# Patient Record
Sex: Male | Born: 1985 | Race: Asian
Health system: Southern US, Academic
[De-identification: ages and names within clinical notes are randomized; demographics above are authoritative.]

## PROBLEM LIST (undated history)

## (undated) ENCOUNTER — Encounter: Attending: Family Medicine

## (undated) ENCOUNTER — Encounter

## (undated) ENCOUNTER — Telehealth

## (undated) ENCOUNTER — Encounter: Attending: Family

## (undated) ENCOUNTER — Other Ambulatory Visit

## (undated) ENCOUNTER — Inpatient Hospital Stay: Payer: Medicare Other

## (undated) ENCOUNTER — Telehealth: Attending: Family

## (undated) HISTORY — PX: HERNIA REPAIR: SHX51

---

## 2016-01-10 ENCOUNTER — Encounter: Attending: Family | Primary: Family

## 2016-01-22 ENCOUNTER — Emergency Department: Admit: 2016-01-22 | Discharge: 2016-01-22

## 2016-01-22 ENCOUNTER — Inpatient Hospital Stay: Admit: 2016-01-22 | Discharge: 2016-01-22

## 2016-01-22 DIAGNOSIS — R197 Diarrhea, unspecified: Secondary | ICD-10-CM

## 2016-01-22 DIAGNOSIS — Z79899 Other long term (current) drug therapy: Secondary | ICD-10-CM

## 2016-01-22 DIAGNOSIS — R109 Unspecified abdominal pain: Principal | ICD-10-CM

## 2016-01-22 DIAGNOSIS — B349 Viral infection, unspecified: Secondary | ICD-10-CM

## 2016-01-22 DIAGNOSIS — I10 Essential (primary) hypertension: Principal | ICD-10-CM

## 2016-01-22 DIAGNOSIS — R0602 Shortness of breath: Secondary | ICD-10-CM

## 2016-01-22 DIAGNOSIS — Z6841 Body Mass Index (BMI) 40.0 and over, adult: Secondary | ICD-10-CM

## 2016-01-22 DIAGNOSIS — K219 Gastro-esophageal reflux disease without esophagitis: Secondary | ICD-10-CM

## 2016-01-22 DIAGNOSIS — R079 Chest pain, unspecified: Secondary | ICD-10-CM

## 2016-01-22 DIAGNOSIS — E669 Obesity, unspecified: Secondary | ICD-10-CM

## 2016-01-22 DIAGNOSIS — R110 Nausea: Secondary | ICD-10-CM

## 2016-01-22 DIAGNOSIS — R112 Nausea with vomiting, unspecified: Secondary | ICD-10-CM

## 2016-01-22 DIAGNOSIS — R1084 Generalized abdominal pain: Secondary | ICD-10-CM

## 2016-01-22 MED ORDER — RANITIDINE HCL 150 MG PO TABS
150 mg | Freq: Two times a day (BID) | ORAL | 0 refills | Status: CP
Start: 2016-01-22 — End: ?

## 2016-01-22 MED ORDER — BOLUS IV FLUID JX
1000 mL | Freq: Once | INTRAVENOUS | Status: CP
Start: 2016-01-22 — End: ?

## 2016-01-22 MED ORDER — FAMOTIDINE 10 MG/ML IV SOLN CUSTOM COMPONENT
20 mg | Freq: Once | INTRAVENOUS | Status: CP
Start: 2016-01-22 — End: ?

## 2016-01-22 MED ORDER — ONDANSETRON HCL 4 MG/2ML IJ SOLN
4 mg | Freq: Once | INTRAVENOUS | Status: CP
Start: 2016-01-22 — End: ?

## 2016-01-22 MED ORDER — MAALOX/HYOSCYAMINE UNIT DOSE JX
Freq: Once | ORAL | Status: CP
Start: 2016-01-22 — End: ?

## 2016-01-22 MED ORDER — ONDANSETRON 8 MG PO TBDP
8 mg | Freq: Three times a day (TID) | ORAL | 0 refills | Status: CP | PRN
Start: 2016-01-22 — End: 2016-08-31

## 2016-01-22 MED ORDER — KETOROLAC TROMETHAMINE 30 MG/ML IJ SOLN
30 mg | Freq: Once | INTRAVENOUS | Status: CP
Start: 2016-01-22 — End: ?

## 2016-01-22 MED ORDER — ESOMEPRAZOLE MAGNESIUM 20 MG PO CPDR
Freq: Every morning | ORAL
Start: 2016-01-22 — End: 2016-08-31

## 2016-01-22 MED ORDER — MORPHINE SULFATE 4 MG/ML IV SOLN CUSTOM JX
4 mg | Freq: Once | INTRAVENOUS | Status: CP
Start: 2016-01-22 — End: ?

## 2016-01-22 MED ORDER — LISINOPRIL 10 MG PO TABS
Freq: Every day | ORAL
Start: 2016-01-22 — End: ?

## 2016-01-22 NOTE — ED Notes
IV removed with catheter intact.  Pressure applied, Time of discharge: 0706  AM., Patient discharged to  Home.  Patient discharged  ambulatory. to exit with belongings in  Improved condition.  Patient escorted by  responsible driver., Written discharge instructions given to  patient.  Patient/recipient  to verbalizes discharge instructions.

## 2016-01-22 NOTE — ED Provider Notes
Clarity, UA Clear  Hazy    Specific Gravity, Urine 1.009  1.003 - 1.030    pH, Urine 6.0  4.5 - 8.0    Protein-UA Negative  Negative mg/dL    Glucose -Ur Negative  Negative mg/dL    Ketones UA Negative  Negative mg/dL    Bilirubin -Ur Negative  Negative    Blood -Ur Negative  Negative    Nitrite -Ur Negative  Negative    Urobilinogen -Ur Normal  Normal    Leukocytes -Ur Negative  Negative    RBC -Ur 2  0 - 5 /HPF    WBC -Ur <1  0 - 5 /HPF    Squam Epithel, UA <1  Not established /HPF    Bacteria -Ur None seen  None seen /HPF    ASCORBIC ACID Negative  20 mg/dL   CBC AND DIFFERENTIAL               Imaging (Read by ED Provider):  not applicable    Per Radiology: Xr Chest 2 Views Frontal & Lateral    Result Date: 01/22/2016  Study: XR CHEST 2 VIEWS FRONTAL & LATERAL HISTORY: [age] years Male. INDICATION: Chest pain. COMPARISON: none. FINDINGS: The cardiomediastinal silhouette and pulmonary vasculature are within normal limits. No focal airspace opacity is seen. Lung volumes are within normal limits. No pneumothorax or pleural effusion is identified. Osseous structures demonstrate no acute abnormality     No acute cardiopulmonary disease. I personally reviewed the images and the residents findings and agree with the above. Read By Valentino Nose- Jamie Ledford M.D.  Electronically Verified By Valentino Nose- Jamie Ledford M.D.  Released Date Time - 01/22/2016 6:03 AM  Resident - Gordy CouncilmanAlexandra High               EKG (Read by ED Provider):      NSR. HR of 62 bpm. No ST elevation. Normal intervals. Nonspecific t-wave abnormality. Slihgt interventricular delay.          MDM  Number of Diagnoses or Management Options  Chest pain, unspecified type:      Amount and/or Complexity of Data Reviewed  Clinical lab tests: ordered and reviewed  Tests in the radiology section of CPT?: ordered and reviewed  Obtain history from someone other than the patient: no  Discuss the patient with other providers: yes              HEART Score:

## 2016-01-22 NOTE — ED Provider Notes
Risk factors: no alcohol abuse, no aspirin use, not elderly, has not had multiple surgeries, no NSAID use and no recent hospitalization        No Known Allergies    Patient's Medications   New Prescriptions    No medications on file   Previous Medications    ESOMEPRAZOLE (NEXIUM) 20 MG CAPSULE DELAYED RELEASE    Take by mouth every morning (30 min before breakfast).    LISINOPRIL (PRINIVIL,ZESTRIL) 10 MG TABLET    Take by mouth daily.   Modified Medications    No medications on file   Discontinued Medications    No medications on file       Past Medical History:   Diagnosis Date   ? GERD (gastroesophageal reflux disease)    ? Hypertension        History reviewed. No pertinent surgical history.    History reviewed. No pertinent family history.    Social History     Social History   ? Marital status: Married     Spouse name: N/A   ? Number of children: N/A   ? Years of education: N/A     Social History Main Topics   ? Smoking status: Never Smoker   ? Smokeless tobacco: Never Used   ? Alcohol use No   ? Drug use: No   ? Sexual activity: Not Asked     Other Topics Concern   ? None     Social History Narrative   ? None       Review of Systems   Constitutional: Positive for fatigue. Negative for fever, chills, activity change and irritability.   HENT: Negative for hearing loss, sore throat and trouble swallowing.    Eyes: Negative for visual disturbance.   Respiratory: Negative for cough and wheezing.    Cardiovascular: Positive for chest pain. Negative for palpitations, leg swelling and exercise intolerance.   Gastrointestinal: Positive for nausea, diarrhea and anorexia. Negative for vomiting, abdominal pain, melena, hematochezia, abdominal distention and hematemesis.   Genitourinary: Negative for dysuria and hematuria.   Neurological: Positive for headaches.   All other systems reviewed and are negative.      Physical Exam       ED Triage Vitals   BP 01/22/16 0446 157/98   Pulse 01/22/16 0446 66

## 2016-01-22 NOTE — ED Notes
Pt arrived to ER via private vehicle with wife. Pt reports nausea, CP, SOB, abdominal pain x 2 days. Pt also reports diarrhea that has also began yesterday,pt has a two children at home who are ill with similar  viruses from school.

## 2016-01-22 NOTE — ED Provider Notes
Resp 01/22/16 0446 16   Temp 01/22/16 0446 36.8 ?C (98.2 ?F)   Temp src 01/22/16 0446 Oral   Height 01/22/16 0446 1.854 m   Weight 01/22/16 0446 147 kg   SpO2 01/22/16 0446 98 %   BMI (Calculated) 01/22/16 0446 42.84       BP (!) 157/98 - Pulse 66 - Temp 36.8 ?C (98.2 ?F) (Oral)  - Resp 16 - Ht 1.854 m - Wt (!) 147 kg - SpO2 98% - BMI 42.75 kg/m2    Physical Exam   Constitutional: He appears well-developed and well-nourished. He is active. No distress.   HENT:   Head: Atraumatic.   Right Ear: Tympanic membrane normal.   Left Ear: Tympanic membrane normal.   Nose: Nose normal.   Mouth/Throat: Mucous membranes are moist. Oropharynx is clear.   Eyes: Conjunctivae are normal. Pupils are equal, round, and reactive to light.   Neck: Normal range of motion and full passive range of motion without pain. Neck supple. No adenopathy.   Cardiovascular: Regular rhythm, S1 normal and S2 normal.    No murmur heard.  Pulmonary/Chest: Effort normal and breath sounds normal. No respiratory distress. No transmitted upper airway sounds. He has no decreased breath sounds. He has no wheezes. He has no rhonchi.   Abdominal: Soft. Bowel sounds are decreased. There is no hepatosplenomegaly. There is no tenderness. There is no rigidity, no rebound and no guarding. No hernia.   Musculoskeletal: He exhibits no edema.   Neurological: He is alert.   Skin: Skin is warm and dry. Capillary refill takes less than 3 seconds. He is not diaphoretic.   Nursing note and vitals reviewed.      Differential DDx: Gastroenteritis, PUD, Cholecystitis, Angina, MI, Other    Is this an Emergent Medical Condition? Yes - Severe Pain/Acute Onset of Symptons  409.901 FS  641.19 FS  627.732 (16) FS    ED Workup   Procedures    Labs:  - - No data to display      Imaging (Read by ED Provider):  not applicable    EKG (Read by ED Provider):  not applicable    MDM  Number of Diagnoses or Management Options  Chest pain, unspecified type:

## 2016-01-22 NOTE — ED Provider Notes
History     Chief Complaint   Patient presents with   ? Abdominal Pain   ? Chest Pain   ? Nausea       HPI Comments: Pt is a 30 yo male who presents to the ED c/o CP with SOB, nausea, and abdominal pain for 2 days. States the CP has been constant. Hx of GERD. Two year history of HTN. No hx of HLD or DM 2. States two of his children have some kind of stomach bug. Denies smoking, EtOH, or drug use. States he was at work when the pain started. Described as a pressure, substernal which does not radiate. States he feels weak and fatigued. Denies fevers, chills, or vomiting. Denies dysuria. Denies travel or tick bites. States the LLQ of his stomach hurts; cramping in nature with no radiation.     Patient is a 30 y.o. male presenting with abdominal pain. The history is provided by the patient.   Abdominal Pain   Pain location:  Generalized  Pain quality: aching, cramping, fullness and pressure    Pain quality: not bloating, not burning, not dull, not sharp, not shooting, not squeezing, not stabbing, no stiffness, not tearing, not throbbing and not tugging    Pain radiates to:  Does not radiate  Pain severity:  Moderate  Onset quality:  Gradual  Duration:  2 days  Timing:  Constant  Progression:  Worsening  Chronicity:  New  Context: sick contacts    Context: not alcohol use, not diet changes, not eating, not laxative use, not previous surgeries, not recent illness, not recent sexual activity, not recent travel, not suspicious food intake and not trauma    Relieved by:  Nothing  Worsened by:  Nothing  Ineffective treatments:  Lying down, liquids, OTC medications, movement, antacids and position changes  Associated symptoms: anorexia, chest pain, diarrhea, fatigue and nausea    Associated symptoms: no chills, no cough, no dysuria, no fever, no hematemesis, no hematochezia, no hematuria, no melena, no sore throat and no vomiting    Risk factors: obesity

## 2016-01-22 NOTE — ED Provider Notes
Urea Nitrogen 10  6 - 22 mg/dL    Creatinine 1.01  0.67 - 1.17 mg/dL    BUN/Creatinine Ratio 9.9  6.0 - 22.0 (calc)    Calcium 9.2  8.6 - 10.0 mg/dL    Osmolality Calc 277.9      Anion Gap 11  4 - 16 mmol/L    EGFR >59  mL/min/1.73M2    Comment:   Reference range: =>90 ml/min/1.73M2  eGFR estimates are unable to accurately differentiate levels of GFR above 60 ml/min/1.73M2.   CBC AUTODIFF - Abnormal     MCH 26.5 (*) 27.0 - 34.0 pg    Monocytes % 11.3 (*) 2.0 - 6.0 %    Basophils % 1.1 (*) 0 - 1 %    WBC 8.06  4.5 - 11 x10E3/uL    RBC 5.51  4.50 - 6.30 x10E6/uL    Hemoglobin 14.6  14.0 - 18.0 g/dL    Hematocrit 45.5  40.0 - 54.0 %    MCV 82.6  82.0 - 101.0 fl    MCHC 32.1  31.0 - 36.0 g/dL    RDW 14.2  12.0 - 16.1 %    Platelet Count 302  140 - 440 thou/cu mm    MPV 9.5  9.5 - 11.5 fl    nRBC % 0.0  0.0 - 1.0 %    Absolute NRBC Count 0.00      Neutrophils % 56.6  34.0 - 73.0 %    Lymphocytes % 28.3  25.0 - 45.0 %    Eosinophils % 1.7  1.0 - 4.0 %    Immature Granulocytes % 1.0  0.0 - 2.0 %    Neutrophils Absolute 4.56  1.80 - 8.70 x10E3/uL    Lymphocytes Absolute 2.28  x10E3/uL    Monocytes Absolute 0.91  x10E3/uL    Eosinophils Absolute 0.14  x10E3/uL    Basophil Absolute 0.09  x10E3/uL   LIPASE - Normal    Lipase 16  0 - 60 U/L   TROPONIN T (ED -ONLY) - Normal    Troponin T <0.01  0.00 - 0.04 ng/ml   HEPATIC FUNCTION PANEL    Albumin 4.1  3.8 - 4.9 g/dL    Total Bilirubin 0.3  0.2 - 1.0 mg/dL    Bilirubin, Direct 0.2  0.0 - 0.2 mg/dL    Bilirubin, Indirect 0.1  mg/dL    Alkaline Phosphatase 64  40 - 129 IU/L    AST 26  14 - 33 IU/L    ALT 32  10 - 42 IU/L    Total Protein 7.2  6.5 - 8.3 g/dL    ALBUMIN/GLOBULIN RATIO 1.3  (calc)    Calc Total Globuin 3.1  gm/dL   URINALYSIS W/MICROSCOPY    Color -Ur Straw  Amber    Clarity, UA Clear  Hazy    Specific Gravity, Urine 1.009  1.003 - 1.030    pH, Urine 6.0  4.5 - 8.0    Protein-UA Negative  Negative mg/dL    Glucose -Ur Negative  Negative mg/dL

## 2016-01-22 NOTE — ED Provider Notes
Neurological: Positive for headaches.   All other systems reviewed and are negative.      Physical Exam       ED Triage Vitals   BP 01/22/16 0446 157/98   Pulse 01/22/16 0446 66   Resp 01/22/16 0446 16   Temp 01/22/16 0446 36.8 ?C (98.2 ?F)   Temp src 01/22/16 0446 Oral   Height 01/22/16 0446 1.854 m   Weight 01/22/16 0446 147 kg   SpO2 01/22/16 0446 98 %   BMI (Calculated) 01/22/16 0446 42.84       BP 128/81 - Pulse 65 - Temp 36.8 ?C (98.2 ?F) (Oral)  - Resp 16 - Ht 1.854 m - Wt (!) 147 kg - SpO2 96% - BMI 42.75 kg/m2    Physical Exam   Constitutional: He appears well-developed and well-nourished. He is active. No distress.   HENT:   Head: Atraumatic.   Right Ear: Tympanic membrane normal.   Left Ear: Tympanic membrane normal.   Nose: Nose normal.   Mouth/Throat: Mucous membranes are moist. Oropharynx is clear.   Eyes: Conjunctivae are normal. Pupils are equal, round, and reactive to light.   Neck: Normal range of motion and full passive range of motion without pain. Neck supple. No adenopathy.   Cardiovascular: Regular rhythm, S1 normal and S2 normal.    No murmur heard.  Pulmonary/Chest: Effort normal and breath sounds normal. No respiratory distress. No transmitted upper airway sounds. He has no decreased breath sounds. He has no wheezes. He has no rhonchi.   Abdominal: Soft. Bowel sounds are decreased. There is no hepatosplenomegaly. There is no tenderness. There is no rigidity, no rebound and no guarding. No hernia.   Musculoskeletal: He exhibits no edema.   Neurological: He is alert.   Skin: Skin is warm and dry. Capillary refill takes less than 3 seconds. He is not diaphoretic.   Nursing note and vitals reviewed.      Differential DDx: Gastroenteritis, PUD, Cholecystitis, Angina, MI, Other    Is this an Emergent Medical Condition? Yes - Severe Pain/Acute Onset of Symptons  409.901 FS  641.19 FS  627.732 (16) FS    ED Workup   Procedures    Labs:  -   BASIC METABOLIC PANEL - Abnormal

## 2016-01-22 NOTE — ED Provider Notes
History   No chief complaint on file.      HPI    Allergies not on file    Patient's Medications    No medications on file       No past medical history on file.    No past surgical history on file.    No family history on file.    Social History     Social History   ? Marital status: Unknown     Spouse name: N/A   ? Number of children: N/A   ? Years of education: N/A     Social History Main Topics   ? Smoking status: Not on file   ? Smokeless tobacco: Not on file   ? Alcohol use Not on file   ? Drug use: Not on file   ? Sexual activity: Not on file     Other Topics Concern   ? Not on file     Social History Narrative       Review of Systems   Constitutional: Negative for fever, chills, activity change, irritability and fatigue.   HENT: Negative for hearing loss, sore throat and trouble swallowing.    Eyes: Negative for visual disturbance.   Respiratory: Negative for cough and wheezing.    Cardiovascular: Negative for chest pain.   Gastrointestinal: Negative for nausea, vomiting, abdominal pain and diarrhea.   All other systems reviewed and are negative.      Physical Exam       ED Triage Vitals   BP --    Pulse --    Resp --    Temp --    Temp src --    Height --    Weight --    SpO2 --    BMI (Calculated) --        There were no vitals taken for this visit.    Physical Exam   Constitutional: He appears well-developed and well-nourished. He is active. No distress.   HENT:   Head: Atraumatic.   Right Ear: Tympanic membrane normal.   Left Ear: Tympanic membrane normal.   Nose: Nose normal.   Mouth/Throat: Mucous membranes are moist. Oropharynx is clear.   Eyes: Conjunctivae are normal. Pupils are equal, round, and reactive to light.   Neck: Neck supple.   Cardiovascular: Regular rhythm.    No murmur heard.  Pulmonary/Chest: Effort normal and breath sounds normal. No respiratory distress.   Abdominal: Soft. There is no tenderness.   Musculoskeletal: He exhibits no edema.   Neurological: He is alert.

## 2016-01-22 NOTE — ED Provider Notes
Amount and/or Complexity of Data Reviewed  Clinical lab tests: ordered and reviewed  Tests in the radiology section of CPT?: ordered and reviewed  Obtain history from someone other than the patient: no  Discuss the patient with other providers: yes              HEART Score:    Hx / suspicion:  None to slight: 0  Moderate: 1  High: 2    EKG:   Normal: 0 Non-specific repol: 1  Significant ST depression: 2    Age:   31 yo or younger: 0  5046 to 31yo: 1  31 yo or older: 2    Risk factors:   HLD, HTN, DM, smoking, positive famhx, obesity   None: 0 1-2: 1  3 or more or atherosclerotic history: 2    Troponin:   Normal: 0 1-3 times normal limit: 1 3 times normal limit: 2    Total Score:      0-3: 0.9-1.7% risk for major adverse cardiac event, low risk   4-7: 12-16.6% risk, moderate risk   8-10: 50-65% risk, high risk          ED Course & Re-Evaluation     ED Course         ED Disposition   ED Disposition: No ED Disposition Set              ED Clinical Impression   ED Clinical Impression:   Chest pain, unspecified type      ED Patient Status   Patient Status:   [facility][id number]}        ED Medical Evaluation Initiated   Medical Evaluation Initiated:   Yes, filed at 01/22/16 0430  by Domingo DimesLeonard, Evan James, PA-C

## 2016-01-22 NOTE — ED Provider Notes
Hx / suspicion:  None to slight: 0  Moderate: 1  High: 2    EKG:   Normal: 0 Non-specific repol: 1  Significant ST depression: 2    Age:   31 yo or younger: 0  4846 to 31yo: 1  165 yo or older: 2    Risk factors:   HLD, HTN, DM, smoking, positive famhx, obesity   None: 0 1-2: 1  3 or more or atherosclerotic history: 2    Troponin:   Normal: 0 1-3 times normal limit: 1 3 times normal limit: 2    Total Score: 3     0-3: 0.9-1.7% risk for major adverse cardiac event, low risk   4-7: 12-16.6% risk, moderate risk   8-10: 50-65% risk, high risk          ED Course & Re-Evaluation     ED Course     Patient has received Morphine, Zofran, and Pepcid. This has resolved the stomach pain but he still has mild CP and a moderate headache.     Domingo DimesEvan James Leonard, PA-C 6:25 AM 01/22/2016      ED Disposition   ED Disposition: Discharge      The results of workup and clinical findings have been discussed with the patient.  The patient has been given IV toradol, Morphine, and Zofran and feels substantially improved. HA has resolved. BRAT diet. Rest. Likely gastroenteritis with GERD. The patient is given prescriptions for the clinical impression, instructions, and follow-up.  The patient verbalizes understanding of the plan and states that patient will comply. The patient understands that patient may return at any time for evaluation should patient's symptoms worsen.      Domingo DimesEvan James Leonard, PA-C 6:51 AM 01/22/2016          ED Clinical Impression   ED Clinical Impression:   Chest pain, unspecified type  Chest pain, unspecified  Viral syndrome  Generalized abdominal pain  Nausea      ED Patient Status   Patient Status:   Good        ED Medical Evaluation Initiated   Medical Evaluation Initiated:   Yes, filed at 01/22/16 0430  by Domingo DimesLeonard, Evan James, PA-C        Pt was seen by PA     Werner LeanFredericksen, Kim Ann, MD  01/22/16 [phone]

## 2016-01-22 NOTE — ED Notes
Pt arrived to ER via private vehicle with wife. Pt reports nausea, CP, SOB, abdominal pain x 2 days. Pt also reports diarrhea that has also began

## 2016-01-22 NOTE — ED Provider Notes
Ketones UA Negative  Negative mg/dL    Bilirubin -Ur Negative  Negative    Blood -Ur Negative  Negative    Nitrite -Ur Negative  Negative    Urobilinogen -Ur Normal  Normal    Leukocytes -Ur Negative  Negative    RBC -Ur 2  0 - 5 /HPF    WBC -Ur <1  0 - 5 /HPF    Squam Epithel, UA <1  Not established /HPF    Bacteria -Ur None seen  None seen /HPF    ASCORBIC ACID Negative  20 mg/dL   CBC AND DIFFERENTIAL         Imaging (Read by ED Provider):  not applicable    EKG (Read by ED Provider):  not applicable    MDM  Number of Diagnoses or Management Options  Chest pain, unspecified type:      Amount and/or Complexity of Data Reviewed  Clinical lab tests: ordered and reviewed  Tests in the radiology section of CPT?: ordered and reviewed  Obtain history from someone other than the patient: no  Discuss the patient with other providers: yes              HEART Score:    Hx / suspicion:  None to slight: 0  Moderate: 1  High: 2    EKG:   Normal: 0 Non-specific repol: 1  Significant ST depression: 2    Age:   31 yo or younger: 0  7246 to 31yo: 1  31 yo or older: 2    Risk factors:   HLD, HTN, DM, smoking, positive famhx, obesity   None: 0 1-2: 1  3 or more or atherosclerotic history: 2    Troponin:   Normal: 0 1-3 times normal limit: 1 3 times normal limit: 2    Total Score: 3     0-3: 0.9-1.7% risk for major adverse cardiac event, low risk   4-7: 12-16.6% risk, moderate risk   8-10: 50-65% risk, high risk          ED Course & Re-Evaluation     ED Course     Patient has received Morphine, Zofran, and Pepcid. This has resolved the stomach pain but he still has mild CP and a moderate headache.     Domingo DimesEvan James Leonard, PA-C 6:25 AM 01/22/2016      ED Disposition   ED Disposition: No ED Disposition Set              ED Clinical Impression   ED Clinical Impression:   Chest pain, unspecified type      ED Patient Status   Patient Status:   {SH ED JX PATIENT [phone]}        ED Medical Evaluation Initiated

## 2016-01-22 NOTE — ED Provider Notes
Resp 01/22/16 0446 16   Temp 01/22/16 0446 36.8 ?C (98.2 ?F)   Temp src 01/22/16 0446 Oral   Height 01/22/16 0446 1.854 m   Weight 01/22/16 0446 147 kg   SpO2 01/22/16 0446 98 %   BMI (Calculated) 01/22/16 0446 42.84       BP 128/81 - Pulse 65 - Temp 36.8 ?C (98.2 ?F) (Oral)  - Resp 16 - Ht 1.854 m - Wt (!) 147 kg - SpO2 96% - BMI 42.75 kg/m2    Physical Exam   Constitutional: He appears well-developed and well-nourished. He is active. No distress.   HENT:   Head: Atraumatic.   Right Ear: Tympanic membrane normal.   Left Ear: Tympanic membrane normal.   Nose: Nose normal.   Mouth/Throat: Mucous membranes are moist. Oropharynx is clear.   Eyes: Conjunctivae are normal. Pupils are equal, round, and reactive to light.   Neck: Normal range of motion and full passive range of motion without pain. Neck supple. No adenopathy.   Cardiovascular: Regular rhythm, S1 normal and S2 normal.    No murmur heard.  Pulmonary/Chest: Effort normal and breath sounds normal. No respiratory distress. No transmitted upper airway sounds. He has no decreased breath sounds. He has no wheezes. He has no rhonchi.   Abdominal: Soft. Bowel sounds are decreased. There is no hepatosplenomegaly. There is no tenderness. There is no rigidity, no rebound and no guarding. No hernia.   Musculoskeletal: He exhibits no edema.   Neurological: He is alert.   Skin: Skin is warm and dry. Capillary refill takes less than 3 seconds. He is not diaphoretic.   Nursing note and vitals reviewed.      Differential DDx: Gastroenteritis, PUD, Cholecystitis, Angina, MI, Other    Is this an Emergent Medical Condition? Yes - Severe Pain/Acute Onset of Symptons  409.901 FS  641.19 FS  627.732 (16) FS    ED Workup   Procedures    Labs:  -   BASIC METABOLIC PANEL - Abnormal        Result Value Ref Range    Glucose 122 (*) 71 - 99 mg/dL    Sodium 540139  981135 - 191145 mmol/L    Potassium 4.2  3.3 - 4.6 mmol/L    Chloride 104  101 - 110 mmol/L    CO2 24  21 - 29 mmol/L

## 2016-01-22 NOTE — ED Provider Notes
Skin: Skin is warm and dry. Capillary refill takes less than 3 seconds. He is not diaphoretic.   Nursing note and vitals reviewed.      Differential DDx: ***    Is this an Emergent Medical Condition? {SH ED EMERGENT MEDICAL [phone]}  409.901 FS  641.19 FS  627.732 (16) FS    ED Workup   Procedures    Labs:  - - No data to display      Imaging (Read by ED Provider):  {Imaging findings:[phone]}    EKG (Read by ED Provider):  {EKG findings:[phone]}    MDM    ED Course & Re-Evaluation     ED Course         ED Disposition   ED Disposition: No ED Disposition Set          ED Clinical Impression   ED Clinical Impression:   No Clinical Impression Set      ED Patient Status   [facility] Status:   [facility][phone][facility]        ED Medical Evaluation Initiated   Medical Evaluation Initiated:   Yes, filed at 01/22/16 0430  by Domingo DimesLeonard, Evan James, PA-C

## 2016-01-22 NOTE — ED Provider Notes
Risk factors: no alcohol abuse, no aspirin use, not elderly, has not had multiple surgeries, no NSAID use and no recent hospitalization        No Known Allergies    Patient's Medications   New Prescriptions    ONDANSETRON (ZOFRAN-ODT) 8 MG TABLET DISINTEGRATING    Dissolve 1 tablet in mouth every 8 hours as needed.    RANITIDINE (ZANTAC) 150 MG TABLET    Take 1 tablet by mouth 2 times daily for 10 days.   Previous Medications    ESOMEPRAZOLE (NEXIUM) 20 MG CAPSULE DELAYED RELEASE    Take by mouth every morning (30 min before breakfast).    LISINOPRIL (PRINIVIL,ZESTRIL) 10 MG TABLET    Take by mouth daily.   Modified Medications    No medications on file   Discontinued Medications    No medications on file       Past Medical History:   Diagnosis Date   ? GERD (gastroesophageal reflux disease)    ? Hypertension        History reviewed. No pertinent surgical history.    History reviewed. No pertinent family history.    Social History     Social History   ? Marital status: Married     Spouse name: N/A   ? Number of children: N/A   ? Years of education: N/A     Social History Main Topics   ? Smoking status: Never Smoker   ? Smokeless tobacco: Never Used   ? Alcohol use No   ? Drug use: No   ? Sexual activity: Not Asked     Other Topics Concern   ? None     Social History Narrative   ? None       Review of Systems   Constitutional: Positive for fatigue. Negative for fever, chills, activity change and irritability.   HENT: Negative for hearing loss, sore throat and trouble swallowing.    Eyes: Negative for visual disturbance.   Respiratory: Negative for cough and wheezing.    Cardiovascular: Positive for chest pain. Negative for palpitations, leg swelling and exercise intolerance.   Gastrointestinal: Positive for nausea, diarrhea and anorexia. Negative for vomiting, abdominal pain, melena, hematochezia, abdominal distention and hematemesis.   Genitourinary: Negative for dysuria and hematuria.

## 2016-01-22 NOTE — ED Provider Notes
Result Value Ref Range    Glucose 122 (*) 71 - 99 mg/dL    Sodium 139  135 - 145 mmol/L    Potassium 4.2  3.3 - 4.6 mmol/L    Chloride 104  101 - 110 mmol/L    CO2 24  21 - 29 mmol/L    Urea Nitrogen 10  6 - 22 mg/dL    Creatinine 1.01  0.67 - 1.17 mg/dL    BUN/Creatinine Ratio 9.9  6.0 - 22.0 (calc)    Calcium 9.2  8.6 - 10.0 mg/dL    Osmolality Calc 277.9      Anion Gap 11  4 - 16 mmol/L    EGFR >59  mL/min/1.73M2    Comment:   Reference range: =>90 ml/min/1.73M2  eGFR estimates are unable to accurately differentiate levels of GFR above 60 ml/min/1.73M2.   CBC AUTODIFF - Abnormal     MCH 26.5 (*) 27.0 - 34.0 pg    Monocytes % 11.3 (*) 2.0 - 6.0 %    Basophils % 1.1 (*) 0 - 1 %    WBC 8.06  4.5 - 11 x10E3/uL    RBC 5.51  4.50 - 6.30 x10E6/uL    Hemoglobin 14.6  14.0 - 18.0 g/dL    Hematocrit 45.5  40.0 - 54.0 %    MCV 82.6  82.0 - 101.0 fl    MCHC 32.1  31.0 - 36.0 g/dL    RDW 14.2  12.0 - 16.1 %    Platelet Count 302  140 - 440 thou/cu mm    MPV 9.5  9.5 - 11.5 fl    nRBC % 0.0  0.0 - 1.0 %    Absolute NRBC Count 0.00      Neutrophils % 56.6  34.0 - 73.0 %    Lymphocytes % 28.3  25.0 - 45.0 %    Eosinophils % 1.7  1.0 - 4.0 %    Immature Granulocytes % 1.0  0.0 - 2.0 %    Neutrophils Absolute 4.56  1.80 - 8.70 x10E3/uL    Lymphocytes Absolute 2.28  x10E3/uL    Monocytes Absolute 0.91  x10E3/uL    Eosinophils Absolute 0.14  x10E3/uL    Basophil Absolute 0.09  x10E3/uL   LIPASE - Normal    Lipase 16  0 - 60 U/L   TROPONIN T (ED -ONLY) - Normal    Troponin T <0.01  0.00 - 0.04 ng/ml   HEPATIC FUNCTION PANEL    Albumin 4.1  3.8 - 4.9 g/dL    Total Bilirubin 0.3  0.2 - 1.0 mg/dL    Bilirubin, Direct 0.2  0.0 - 0.2 mg/dL    Bilirubin, Indirect 0.1  mg/dL    Alkaline Phosphatase 64  40 - 129 IU/L    AST 26  14 - 33 IU/L    ALT 32  10 - 42 IU/L    Total Protein 7.2  6.5 - 8.3 g/dL    ALBUMIN/GLOBULIN RATIO 1.3  (calc)    Calc Total Globuin 3.1  gm/dL   URINALYSIS W/MICROSCOPY    Color -Ur Straw  [employer]

## 2016-01-22 NOTE — ED Provider Notes
Hx / suspicion:  None to slight: 0  Moderate: 1  High: 2    EKG:   Normal: 0 Non-specific repol: 1  Significant ST depression: 2    Age:   31 yo or younger: 0  646 to 31yo: 1  31 yo or older: 2    Risk factors:   HLD, HTN, DM, smoking, positive famhx, obesity   None: 0 1-2: 1  3 or more or atherosclerotic history: 2    Troponin:   Normal: 0 1-3 times normal limit: 1 3 times normal limit: 2    Total Score: 3     0-3: 0.9-1.7% risk for major adverse cardiac event, low risk   4-7: 12-16.6% risk, moderate risk   8-10: 50-65% risk, high risk          ED Course & Re-Evaluation     ED Course     Patient has received Morphine, Zofran, and Pepcid. This has resolved the stomach pain but he still has mild CP and a moderate headache.     Domingo DimesEvan James Leonard, PA-C 6:25 AM 01/22/2016      ED Disposition   ED Disposition: Discharge      The results of workup and clinical findings have been discussed with the patient.  The patient has been given IV toradol, Morphine, and Zofran and feels substantially improved. HA has resolved. BRAT diet. Rest. Likely gastroenteritis with GERD. The patient is given prescriptions for the clinical impression, instructions, and follow-up.  The patient verbalizes understanding of the plan and states that patient will comply. The patient understands that patient may return at any time for evaluation should patient's symptoms worsen.      Domingo DimesEvan James Leonard, PA-C 6:51 AM 01/22/2016          ED Clinical Impression   ED Clinical Impression:   Chest pain, unspecified type  Chest pain, unspecified  Viral syndrome  Generalized abdominal pain  Nausea      ED Patient Status   Patient Status:   Good        ED Medical Evaluation Initiated   Medical Evaluation Initiated:   Yes, filed at 01/22/16 0430  by Domingo DimesLeonard, Evan James, PA-C

## 2016-01-22 NOTE — ED Provider Notes
Medical Evaluation Initiated:   Yes, filed at 01/22/16 0430  by Domingo DimesLeonard, Evan James, PA-C

## 2016-01-22 NOTE — ED Provider Notes
Risk factors: no alcohol abuse, no aspirin use, not elderly, has not had multiple surgeries, no NSAID use and no recent hospitalization        No Known Allergies    Patient's Medications   New Prescriptions    No medications on file   Previous Medications    ESOMEPRAZOLE (NEXIUM) 20 MG CAPSULE DELAYED RELEASE    Take by mouth every morning (30 min before breakfast).    LISINOPRIL (PRINIVIL,ZESTRIL) 10 MG TABLET    Take by mouth daily.   Modified Medications    No medications on file   Discontinued Medications    No medications on file       Past Medical History:   Diagnosis Date   ? GERD (gastroesophageal reflux disease)    ? Hypertension        History reviewed. No pertinent surgical history.    History reviewed. No pertinent family history.    Social History     Social History   ? Marital status: Unknown     Spouse name: N/A   ? Number of children: N/A   ? Years of education: N/A     Social History Main Topics   ? Smoking status: Never Smoker   ? Smokeless tobacco: Never Used   ? Alcohol use No   ? Drug use: No   ? Sexual activity: Not Asked     Other Topics Concern   ? None     Social History Narrative   ? None       Review of Systems   Constitutional: Positive for fatigue. Negative for fever, chills, activity change and irritability.   HENT: Negative for hearing loss, sore throat and trouble swallowing.    Eyes: Negative for visual disturbance.   Respiratory: Negative for cough and wheezing.    Cardiovascular: Positive for chest pain. Negative for palpitations, leg swelling and exercise intolerance.   Gastrointestinal: Positive for nausea, diarrhea and anorexia. Negative for vomiting, abdominal pain, melena, hematochezia, abdominal distention and hematemesis.   Genitourinary: Negative for dysuria and hematuria.   Neurological: Positive for headaches.   All other systems reviewed and are negative.      Physical Exam       ED Triage Vitals   BP 01/22/16 0446 157/98   Pulse 01/22/16 0446 66

## 2016-01-28 DIAGNOSIS — K219 Gastro-esophageal reflux disease without esophagitis: Secondary | ICD-10-CM

## 2016-01-28 DIAGNOSIS — I10 Essential (primary) hypertension: Principal | ICD-10-CM

## 2016-04-29 DIAGNOSIS — F5102 Adjustment insomnia: Principal | ICD-10-CM

## 2016-04-29 DIAGNOSIS — L230 Allergic contact dermatitis due to metals: Secondary | ICD-10-CM

## 2016-04-30 MED ORDER — TRAZODONE HCL 50 MG PO TABS
0 refills | Status: CP
Start: 2016-04-30 — End: 2016-06-12

## 2016-04-30 MED ORDER — TRIAMCINOLONE ACETONIDE 0.1 % EX OINT
2 refills | Status: CP
Start: 2016-04-30 — End: 2016-08-31

## 2016-06-08 ENCOUNTER — Encounter: Attending: Family | Primary: Family

## 2016-06-11 DIAGNOSIS — F5102 Adjustment insomnia: Principal | ICD-10-CM

## 2016-06-11 MED ORDER — TRAZODONE HCL 50 MG PO TABS
0 refills | Status: CP
Start: 2016-06-11 — End: 2016-08-31

## 2016-08-28 ENCOUNTER — Emergency Department (HOSPITAL_COMMUNITY): Payer: Self-pay

## 2016-08-28 ENCOUNTER — Emergency Department (HOSPITAL_COMMUNITY)
Admission: EM | Admit: 2016-08-28 | Discharge: 2016-08-28 | Disposition: A | Discharge status: Home / Self Care | Payer: Self-pay | Attending: Emergency Medicine | Admitting: Emergency Medicine

## 2016-08-28 ENCOUNTER — Encounter (HOSPITAL_COMMUNITY): Payer: Self-pay | Admitting: Nurse Practitioner

## 2016-08-28 DIAGNOSIS — M25571 Pain in right ankle and joints of right foot: Secondary | ICD-10-CM | POA: Insufficient documentation

## 2016-08-28 MED ORDER — TRAMADOL HCL 50 MG PO TABS: 50.0000 mg | ORAL_TABLET | Freq: Four times a day (QID) | ORAL | 0 refills | Status: AC | PRN

## 2016-08-28 MED ORDER — HYDROCODONE-ACETAMINOPHEN 5-325 MG PO TABS
1.0000 | ORAL_TABLET | Freq: Once | ORAL | Status: AC
  Administered 2016-08-28: 1 via ORAL
  Filled 2016-08-28: qty 1

## 2016-08-28 NOTE — ED Triage Notes (Signed)
Pt brought in via ems. Patient is here in [place] visiting from [place]. Per ems patient states about 2 days ago ankle starting throbbing and swelling a little. Tonight the pain woke him up and became a constant stabbing pain. He took a hot bath and applied ice but then pain worsened and is now unable to bear weight to the right foot. Pt denies any injury to the foot or ankle. Denies leg or calf pain, history of dvt. Reports he has had tendonitis in the area before. Pain 10/10. Right ankle is edematous with positive movement and strong pedal pulse. BP 190/100, 90, 18.

## 2016-08-28 NOTE — ED Provider Notes (Signed)
[facility]-EMERGENCY DEPT Provider Note   CSN: [id number] Arrival date & time: 08/28/16  7829     History   Chief Complaint Chief Complaint  Patient presents with  . Foot Pain    HPI Cody James is a 31 y.o. male.  The history is provided by the patient and medical records.  Foot Pain      31 year old male here with right foot and ankle pain.  Patient reports this is been ongoing for the past 2 days. 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 He does report climbing a ladder a few 2 days ago, but does not remember any trauma or fall. States pain is localized to the medial left foot and ankle, worse with weightbearing and ambulation, better with sitting still and elevating his foot. He denies any numbness or tingling of his foot or ankle. Does report history of tendinitis in his foot several years ago. He has not had any fever or chills. No redness or swelling. He has not had any calf pain. No chest pain or shortness of breath. States he did try taking some Tylenol and Motrin without any significant relief. He is planning to return to [place] tomorrow.  History reviewed. No pertinent past medical history.  There are no active problems to display for this patient.   Past Surgical History:  Procedure Laterality Date  . HERNIA REPAIR         Home Medications    Prior to Admission medications   Not on File    Family History History reviewed. No pertinent family history.  Social History Social History  Substance Use Topics  . Smoking status: Never Smoker  . Smokeless tobacco: Never Used  . Alcohol use No     Allergies   Patient has no known allergies.   Review of Systems Review of Systems  Musculoskeletal: Positive for arthralgias.  All other systems reviewed and are negative.    Physical Exam Updated Vital Signs BP (!) 154/90 (BP Location: Left Arm)   Pulse 90   Temp 99.7 F (37.6 C) (Oral)   Resp 20   Ht 6'  1" (1.854 m)   Wt (!) 140.6 kg   SpO2 95%   BMI 40.90 kg/m   Physical Exam  Constitutional: He is oriented to person, place, and time. He appears well-developed and well-nourished.  HENT:  Head: Normocephalic and atraumatic.  Mouth/Throat: Oropharynx is clear and moist.  Eyes: Conjunctivae and EOM are normal. Pupils are equal, round, and reactive to light.  Neck: Normal range of motion.  Cardiovascular: Normal rate, regular rhythm and normal heart sounds.   Pulmonary/Chest: Effort normal and breath sounds normal.  Abdominal: Soft. Bowel sounds are normal.  Musculoskeletal: Normal range of motion.       Right ankle: Tenderness. Medial malleolus tenderness found. Achilles tendon normal. Achilles tendon exhibits normal Thompson's test results.       Feet:  Tenderness of medial right ankle extending into the arch of the foot; there is no swelling, bony deformity, erythema, or other overlying skin changes; limited ROM of the right ankle due to pain; moving all toes normally; DP pulse intact; foot is warm, well perfused, normal sensation throughout Achilles non-tender; no calf pain, tenderness, or palpable cords  Neurological: He is alert and oriented to person, place, and time.  Skin: Skin is warm and dry.  Psychiatric: He has a normal mood and affect.  Nursing note and vitals reviewed.  ED Treatments / Results  Labs (all labs ordered are listed, but only abnormal results are displayed) Labs Reviewed - No data to display  EKG  EKG Interpretation None       Radiology Dg Ankle Complete Right  Result Date: 08/28/2016 CLINICAL DATA:  Acute right ankle pain without known injury. EXAM: RIGHT ANKLE - COMPLETE 3+ VIEW COMPARISON:  None. FINDINGS: There is no evidence of fracture, dislocation, or joint effusion. There is no evidence of arthropathy or other focal bone abnormality. Soft tissues are unremarkable. IMPRESSION: Normal right ankle. Electronically Signed   By: Lupita Raider, M.D.   On: 08/28/2016 07:45   Dg Foot Complete Right  Result Date: 08/28/2016 CLINICAL DATA:  Acute right foot pain without known injury. EXAM: RIGHT FOOT COMPLETE - 3+ VIEW COMPARISON:  None. FINDINGS: There is no evidence of fracture or dislocation. There is no evidence of arthropathy or other focal bone abnormality. Soft tissues are unremarkable. IMPRESSION: Normal right foot. Electronically Signed   By: Lupita Raider, M.D.   On: 08/28/2016 07:43    Procedures Procedures (including critical care time)  Medications Ordered in ED Medications  HYDROcodone-acetaminophen (NORCO/VICODIN) 5-325 MG per tablet 1 tablet (1 tablet Oral Given 08/28/16 0705)     Initial Impression / Assessment and Plan / ED Course  I have reviewed the triage vital signs and the nursing notes.  Pertinent labs & imaging results that were available during my care of the patient were reviewed by me and considered in my medical decision making (see chart for details).  31 year old male here with right foot and ankle pain. Denies any recent injury, trauma, or falls. Does have history of tendinitis in this area. He has no significant swelling, bony deformity, overlying erythema, warmth to touch, or other acute changes about the right ankle. She does have tenderness along the medial joint line extending into the arch foot. He has no calf swelling, tenderness, or palpable cords. His DP pulses are intact bilaterally. X-rays obtained, no acute bony findings. May be recurrence of his tendinitis. No signs or symptoms concerning for septic joint, DVT, or other emergent pathology. He'll be returning home to [place] tomorrow, I have recommended that he follow-up closely with his primary care doctor. He was placed in ASO brace and given crutches for now. Will discharge home with tramadol.  Discussed plan with patient, he acknowledged understanding and agreed with plan of care.  Return precautions given for new or worsening  symptoms.  Final Clinical Impressions(s) / ED Diagnoses   Final diagnoses:  Acute right ankle pain    New Prescriptions New Prescriptions   TRAMADOL (ULTRAM) 50 MG TABLET    Take 1 tablet (50 mg total) by mouth every 6 (six) hours as needed.     Garlon Hatchet, PA-C 08/28/16 1610    Pricilla Loveless, MD 08/28/16 2312

## 2016-08-28 NOTE — ED Notes (Signed)
Bed: ZO10 Expected date:  Expected time:  Means of arrival:  Comments: Foot pain

## 2016-08-28 NOTE — Discharge Instructions (Signed)
Take the prescribed medication as directed. Follow-up with your doctor once you return home to [place]. Return to the ED for new or worsening symptoms.

## 2016-08-31 ENCOUNTER — Ambulatory Visit: Attending: Family | Primary: Family

## 2016-08-31 DIAGNOSIS — I10 Essential (primary) hypertension: Secondary | ICD-10-CM

## 2016-08-31 DIAGNOSIS — M79604 Pain in right leg: Secondary | ICD-10-CM

## 2016-08-31 DIAGNOSIS — Z833 Family history of diabetes mellitus: Secondary | ICD-10-CM

## 2016-08-31 DIAGNOSIS — Z760 Encounter for issue of repeat prescription: Secondary | ICD-10-CM

## 2016-08-31 DIAGNOSIS — L230 Allergic contact dermatitis due to metals: Secondary | ICD-10-CM

## 2016-08-31 DIAGNOSIS — J302 Other seasonal allergic rhinitis: Secondary | ICD-10-CM

## 2016-08-31 DIAGNOSIS — K219 Gastro-esophageal reflux disease without esophagitis: Secondary | ICD-10-CM

## 2016-08-31 DIAGNOSIS — E559 Vitamin D deficiency, unspecified: Secondary | ICD-10-CM

## 2016-08-31 DIAGNOSIS — F5102 Adjustment insomnia: Secondary | ICD-10-CM

## 2016-08-31 DIAGNOSIS — E538 Deficiency of other specified B group vitamins: Secondary | ICD-10-CM

## 2016-08-31 DIAGNOSIS — Z1329 Encounter for screening for other suspected endocrine disorder: Secondary | ICD-10-CM

## 2016-08-31 DIAGNOSIS — E291 Testicular hypofunction: Secondary | ICD-10-CM

## 2016-08-31 DIAGNOSIS — M25571 Pain in right ankle and joints of right foot: Secondary | ICD-10-CM

## 2016-08-31 DIAGNOSIS — Z6839 Body mass index (BMI) 39.0-39.9, adult: Principal | ICD-10-CM

## 2016-08-31 MED ORDER — CYANOCOBALAMIN 1000 MCG PO TABS
1 | Freq: Every day | ORAL
Start: 2016-08-31 — End: 2016-10-14

## 2016-08-31 MED ORDER — IBUPROFEN 800 MG PO TABS
800 mg | Freq: Four times a day (QID) | ORAL | 1 refills | Status: CP | PRN
Start: 2016-08-31 — End: 2016-10-14

## 2016-08-31 MED ORDER — AZELASTINE HCL 0.1 % NA SOLN
1 | Freq: Two times a day (BID) | NASAL | 3 refills | Status: CP
Start: 2016-08-31 — End: 2016-10-14

## 2016-08-31 MED ORDER — TRAZODONE HCL 50 MG PO TABS
0 refills | Status: CP
Start: 2016-08-31 — End: 2016-09-28

## 2016-08-31 MED ORDER — TRIAMCINOLONE ACETONIDE 0.1 % EX OINT
Freq: Two times a day (BID) | TOPICAL | 2 refills | Status: CP
Start: 2016-08-31 — End: 2016-10-14

## 2016-08-31 MED ORDER — ESOMEPRAZOLE MAGNESIUM 40 MG PO CPDR
40 mg | Freq: Every morning | ORAL | 5 refills | Status: CP
Start: 2016-08-31 — End: 2016-10-14

## 2016-08-31 NOTE — Progress Notes
**   Merged History Encounter **              Allergies: No Known Allergies    Review of Systems     ROS as it applies to the last 24 hours.    Skin: No rashes, No lumps, No itching, No jaundice, No moles.    Eyes: No blurred vision, No discomfort, No  pain, No redness, No discharge, No cataracts.    Ears: No hearing deficit, No tinnitus, + ear pain, No discharge.     Nose: No stuffiness, No nose bleed.     M & T: No toothache, No bleeding gums, No swallowing difficulty, No sore throat.     Neck: No mass, No pain, No nuchal rigidity, No lymphadenopathy, No stiffness.     Resp: No cough, No hemoptysis, No SOB, No wheezing.    CV: No palpitations, No pain, No edema, No claudication, No varicose veins.    GI: No nausea, No vomiting, No anorexia, No heartburn, No constipation.     GU: No dysuria, No nocturia, No hematuria, No incontinence, No flank pain.    MS: No joint stiffness, No joint swelling, No back pain; +left ankle/foot pain    Neuro: No fainting, No dizziness, No numbness, No tremor, No amnesia.     Heme/lymphatic: No anemia, No easy bruising, No easy bleeding.     Endocrine: No heat or cold intolerance, No polydipsia, No polyuria, No polyphagia.     General: No Fevers, No weight loss, No weakness, No night sweating.     Physical Exam     Constitution: Well developed, well nourished, appears stated age, ambulated from waiting area: in no distress      HEENT:Lids without erythema or lesion, conjunctivae pink, sclera white, cornea clear. External ears symmetric without lesions or deformity, canals clear, bilateral TM's fluid filled.  Hears soft spoken voice without difficulty, nares patent with erythematic and edematous mucosa, + clear discharge ; No sinus tenderness.  Oropharynx without lesions, erythema or exudate.      NECK: without thyromegaly, nodules or masses.      RESPIRATORY: Non labored breathing, symmetric chest wall movement, No wheezes, rales or rhonchi, chest resonant.

## 2016-08-31 NOTE — Progress Notes
?   losartan (COZAAR) 25 MG Tablet Take 1 tablet by mouth daily. 30 tablet 3   ? [DISCONTINUED] ondansetron (ZOFRAN-ODT) 8 MG Tablet Disintegrating Dissolve 1 tablet in mouth every 8 hours as needed. 18 tablet 0   ? sildenafil (VIAGRA) 100 MG Tablet Take 1 tablet by mouth as needed for erectile dysfunction. 10 tablet 0   ? tadalafil (CIALIS) 20 MG Tablet Take 1 tablet by mouth as needed for erectile dysfunction. Take 1/2 to 1 tab by mouth as needed for erectile dysfunction. 6 tablet 1     No current facility-administered medications on file prior to visit.        Plan     Patient ordered:   Referral to podiatry placed  RICE treatment for left ankle/foot  Motrin ordered (ankle pain)  Trazodone refilled (insomnia)  Nexium refilled (GERD)  Kenalog refilled (contact dermatis)  Astelin ordered (allergic rhinitis)  Take OTC Zyrtec (allergic rhinitis)  Lab studies ordered and obtained in clinic  Patient advised to be vigilant and to report any further worsening symptoms  If symptoms continue or worsen, please seek treatment at ED.      Reviewed previous labs/diagnostics/notes with patient.   F/u  as scheduled or sooner as needed.    Discussed plan of care with patient.  Patient is also advised to various treatment options and  agreed to the following: taking medication as directed, keeping follow up  visits.  Risks verses benefits of medication and treatment were discussed.   Patient is instructed to  increase and maintain  activity level, to adopt a healthy diet.  Patient voiced  agreement  and understanding.      M. Willa Rough, DNP

## 2016-08-31 NOTE — Progress Notes
HGB 14.6 01/22/2016    HGB 15.1 07/09/2015    HCT 45.5 01/22/2016    HCT 47.7 07/09/2015    MCV 82.6 01/22/2016    MCV 84.5 07/09/2015       Lab Results   Component Value Date/Time    NA 139 01/22/2016 05:27 AM    NA 141 07/09/2015 08:53 AM    K 4.2 01/22/2016 05:27 AM    K 4.3 07/09/2015 08:53 AM    CL 104 01/22/2016 05:27 AM    CL 107 07/09/2015 08:53 AM    BUN 10 01/22/2016 05:27 AM    BUN 13 07/09/2015 08:53 AM    CREATININE 1.01 01/22/2016 05:27 AM    CREATININE 1.15 07/09/2015 08:53 AM    GLU 122 (H) 01/22/2016 05:27 AM    GLU 94 07/09/2015 08:53 AM    CALCIUM 9.2 01/22/2016 05:27 AM    CALCIUM 9.6 07/09/2015 08:53 AM       Lab Results   Component Value Date/Time    AST 26 01/22/2016 05:27 AM    AST 27 07/09/2015 08:53 AM    ALT 32 01/22/2016 05:27 AM    ALT 32 07/09/2015 08:53 AM       Lab Results   Component Value Date/Time    CHOL 139 07/09/2015 08:53 AM    CHOL 167 07/02/2014 02:40 PM    TRIG 63 07/09/2015 08:53 AM    TRIG 69 07/02/2014 02:40 PM    HDL 46 07/09/2015 08:53 AM    HDL 48 07/02/2014 02:40 PM    LDL 80 07/09/2015 08:53 AM    LDL 105 07/02/2014 02:40 PM        Lab Results   Component Value Date    TSH 0.85 07/02/2014       Lab Results   Component Value Date/Time    HGBA1C 5.1 09/24/2015       PMH as follows:   Past Medical History:   Diagnosis Date   ? GERD (gastroesophageal reflux disease)    ? Hypertension      PSH as follows:   Past Surgical History:   Procedure Laterality Date   ? NO PRIOR SURGERIES         Social history:   Social History     Social History   ? Marital status: Married     Spouse name: N/A   ? Number of children: N/A   ? Years of education: N/A     Occupational History   ? Not on file.     Social History Main Topics   ? Smoking status: Never Smoker   ? Smokeless tobacco: Never Used   ? Alcohol use No   ? Drug use: No   ? Sexual activity: Yes     Partners: Female     Birth control/ protection: Surgical     Other Topics Concern   ? Not on file     Social History Narrative

## 2016-08-31 NOTE — Progress Notes
Patient, Barbara Keng, 31 y.o. AA male, is here in clinic today for right ankle/foot pain for the past 3 days. Patient reports that he noticed pain a day after he had performed a continued motion of climbing up and hopping down approximately 3 feet. Patient was seen at emergency department in [facility] when pain was noticed. Patient reports having an xray of ankle and foot completed and resulted as negative. Weight bearing increases pain and patient explains that rest and motrin improves pain. Pain is rated as 6 out of 10 today in clinic. Today, pain is located to medial left ankle and foot area with slight bruising and swelling noted. Patient has FROm but pain with extension and flexion. Patient was encouraged to rest, ice, use compression, and elevate ankle. Motrin prescribed today and patient referral to podiatry for further evaluation and treatment of ankle/foot pain.   Patient also complains of bilateral ear pain. Upon physical exam, bilateral TMs have fluid and nare mucous is edematous. Astelin nasal spray prescribed and patient was educated on proper use of medication, and patient instructed to take OTC Zyrtec also. In addition, patient requesting refills for trazodone (for insomnia), nexium (GERD), and kenalog (contact dermitis) which he starts are working as expected. All medications refilled per request.  He also complains of fatigue. Lab studies ordered today and obtained in clinic to include: CBC, CMP, Lipid, TSH, Testerone, Vitamin B, & Vitamin D. Patient to follow up for lab review. Patient endorses controlled blood pressure with current regimen. Patient encouraged to eat a healthy diet, exercise, and increase water intake. Patient agrees to plan of care and verbalizes understanding.  Data Review:    BP (!) 139/97 - Pulse 69 - Temp 37.4 ?C (99.3 ?F) - Resp 16 - Ht 1.88 m ( ) - Wt (!) 139.7 kg (308 lb) - BMI 39.54 kg/m2    Lab Results   Component Value Date    WBC 8.06 01/22/2016

## 2016-08-31 NOTE — Progress Notes
Reason:  Physician ordered labs  Amount:  4 Tubes  Type:  Butterfly  Site:  Vein  left arm  Reaction:  None    Draw performed by:  [id number],  08/31/2016

## 2016-08-31 NOTE — Patient Instructions
?   How often you have ankle pain.  ? Where the pain is located.  ? What the pain feels like.  ? Take over-the-counter and prescription medicines only as told by your health care provider.  Contact a health care provider if:  ? Your pain gets worse.  ? Your pain is not relieved with medicines.  ? You have a fever or chills.  ? You are having more trouble with walking.  ? You have new symptoms.  Get help right away if:  ? Your foot, leg, toes, or ankle tingles or becomes numb.  ? Your foot, leg, toes, or ankle becomes swollen.  ? Your foot, leg, toes, or ankle turns pale or blue.  This information is not intended to replace advice given to you by your health care provider. Make sure you discuss any questions you have with your health care provider.  Document Released: 10/22/2009 Document Revised: 01/03/2016 Document Reviewed: 12/04/2014  [employer] Interactive Patient Education ? 2017 [employer]

## 2016-08-31 NOTE — Progress Notes
CV: nondisplaced PMI, RRR with normal S1/S2, no S3 or S4, no murmur: no JVD;  no abnormal pulsations,  no pedal edema       MUSCULOSKELETAL: Normal gait and station, spine without deformity, good muscle tone and strength, + swelling, tenderness to palpation, and tenderness with ROM.     SKIN: warm with patients usual color.  No rashes or suspicious lesions noted.     NEUROLOGIC: CN: II-XII grossly intact.      LYMPHATIC: no neck, axillary, or inguinal lymphadenopathy noted.     Psyche: oriented to time, person, place; normal speech and content;  appropriate mood and affect;  interactive and responsive; memory, judgment, and insight are intact      Assessment     Encounter Diagnoses   Name Primary?   ? Body mass index 39.0-39.9, adult    ? Chronic GERD    ? Transient insomnia    ? Contact dermatitis due to metals, unspecified contact dermatitis type    ? Morbid obesity    ? Medication refill    ? Acute right ankle pain    ? Pain of right lower extremity    ? Hypogonadism male    ? Family history of diabetes mellitus (DM)    ? Vitamin D deficiency    ? Vitamin B 12 deficiency    ? Thyroid disorder screening    ? Essential hypertension      Current Outpatient Prescriptions on File Prior to Visit   Medication Sig Dispense Refill   ? [DISCONTINUED] esomeprazole (NexIUM) 40 MG Capsule Delayed Release Take 1 capsule by mouth every morning (30 min before breakfast). 30 capsule 5   ? [DISCONTINUED] traZODone (DESYREL) 50 MG PO Tablet TAKE 1 TO 2 TABLETS BY MOUTH 1 HOUR BEFORE BEDTIME 60 tablet 0   ? [DISCONTINUED] triamcinolone (KENALOG) 0.1 % EX Ointment APPLY TWICE A DAY 454 g 2   ? [DISCONTINUED] esomeprazole (NexIUM) 20 MG Capsule Delayed Release Take by mouth every morning (30 min before breakfast).     ? hydroCHLOROthiazide (HYDRODIURIL) 25 MG Tablet Take 1 tablet by mouth daily. 30 tablet 3   ? lisinopril (PRINIVIL,ZESTRIL) 10 MG Tablet Take by mouth daily.

## 2016-08-31 NOTE — Progress Notes
No teaching statement needed

## 2016-08-31 NOTE — Patient Instructions
Ankle Pain  Many things can cause ankle pain, including an injury to the area and overuse of the ankle.?The ankle joint holds your body weight and allows you to move around. Ankle pain can occur on either side or the back of one ankle or both ankles. Ankle pain may be sharp and burning or dull and aching. There may be tenderness, stiffness, redness, or warmth around the ankle.  Follow these instructions at home:  Activity  ? Rest your ankle as told by your health care provider. Avoid any activities that cause ankle pain.  ? Do exercises as told by your health care provider.  ? Ask your health care provider if you can drive.  Using a brace, a bandage, or crutches  ? If you were given a brace:  ? Wear it as told by your health care provider.  ? Remove it when you take a bath or a shower.  ? Try not to move your ankle very much, but wiggle your toes from time to time. This helps to prevent swelling.  ? If you were given an elastic bandage:  ? Remove it when you take a bath or a shower.  ? Try not to move your ankle very much, but wiggle your toes from time to time. This helps to prevent swelling.  ? Adjust the bandage to make it more comfortable if it feels too tight.  ? Loosen the bandage if you have numbness or tingling in your foot or if your foot turns cold and blue.  ? If you have crutches, use them as told by your health care provider. Continue to use them until you can walk without feeling pain in your ankle.  Managing pain, stiffness, and swelling  ? Raise (elevate) your ankle above the level of your heart while you are sitting or lying down.  ? If directed, apply ice to the area:  ? Put ice in a plastic bag.  ? Place a towel between your skin and the bag.  ? Leave the ice on for 20 minutes, 2?3 times per day.  General instructions  ? Keep all follow-up visits as told by your health care provider. This is important.  ? Record this information that may be helpful for you and your health care provider:

## 2016-09-04 ENCOUNTER — Encounter: Attending: Family | Primary: Family

## 2016-09-27 DIAGNOSIS — F5102 Adjustment insomnia: Principal | ICD-10-CM

## 2016-09-28 MED ORDER — TRAZODONE HCL 50 MG PO TABS
0 refills | Status: CP
Start: 2016-09-28 — End: 2016-10-14

## 2016-10-14 ENCOUNTER — Ambulatory Visit: Attending: Family | Primary: Family

## 2016-10-14 DIAGNOSIS — E538 Deficiency of other specified B group vitamins: Secondary | ICD-10-CM

## 2016-10-14 DIAGNOSIS — F5102 Adjustment insomnia: Secondary | ICD-10-CM

## 2016-10-14 DIAGNOSIS — M79604 Pain in right leg: Secondary | ICD-10-CM

## 2016-10-14 DIAGNOSIS — K219 Gastro-esophageal reflux disease without esophagitis: Secondary | ICD-10-CM

## 2016-10-14 DIAGNOSIS — L230 Allergic contact dermatitis due to metals: Secondary | ICD-10-CM

## 2016-10-14 DIAGNOSIS — E559 Vitamin D deficiency, unspecified: Secondary | ICD-10-CM

## 2016-10-14 DIAGNOSIS — Z760 Encounter for issue of repeat prescription: Secondary | ICD-10-CM

## 2016-10-14 DIAGNOSIS — Z6839 Body mass index (BMI) 39.0-39.9, adult: Principal | ICD-10-CM

## 2016-10-14 DIAGNOSIS — M25571 Pain in right ankle and joints of right foot: Secondary | ICD-10-CM

## 2016-10-14 DIAGNOSIS — I10 Essential (primary) hypertension: Principal | ICD-10-CM

## 2016-10-14 DIAGNOSIS — J302 Other seasonal allergic rhinitis: Secondary | ICD-10-CM

## 2016-10-14 MED ORDER — AZELASTINE HCL 0.1 % NA SOLN
1 | Freq: Two times a day (BID) | NASAL | 3 refills | Status: CP
Start: 2016-10-14 — End: ?

## 2016-10-14 MED ORDER — ESOMEPRAZOLE MAGNESIUM 40 MG PO CPDR
40 mg | Freq: Every morning | ORAL | 5 refills | Status: CP
Start: 2016-10-14 — End: ?

## 2016-10-14 MED ORDER — IBUPROFEN 800 MG PO TABS
800 mg | Freq: Four times a day (QID) | ORAL | 1 refills | Status: CP | PRN
Start: 2016-10-14 — End: 2017-03-05

## 2016-10-14 MED ORDER — ERGOCALCIFEROL 1.25 MG (50000 UT) PO CAPS
50000 [IU] | ORAL | 1 refills | Status: CP
Start: 2016-10-14 — End: 2017-05-24

## 2016-10-14 MED ORDER — CYANOCOBALAMIN 1000 MCG PO TABS
1000 ug | Freq: Every day | ORAL | 2 refills | Status: CP
Start: 2016-10-14 — End: ?

## 2016-10-14 MED ORDER — HYDROCHLOROTHIAZIDE 25 MG PO TABS
25 mg | Freq: Every day | ORAL | 3 refills | Status: CP
Start: 2016-10-14 — End: ?

## 2016-10-14 MED ORDER — TRAZODONE HCL 50 MG PO TABS
0 refills | Status: CP
Start: 2016-10-14 — End: 2017-01-11

## 2016-10-14 MED ORDER — TRIAMCINOLONE ACETONIDE 0.1 % EX OINT
Freq: Two times a day (BID) | TOPICAL | 2 refills | Status: CP
Start: 2016-10-14 — End: 2018-01-11

## 2016-10-14 NOTE — Progress Notes
LDL 80 07/09/2015 08:53 AM    LDL 105 07/02/2014 02:40 PM        Lab Results   Component Value Date    TSH 1.310 08/31/2016    TSH 0.85 07/02/2014       Lab Results   Component Value Date/Time    HGBA1C 5.1 09/24/2015       PMH as follows:   Past Medical History:   Diagnosis Date   ? GERD (gastroesophageal reflux disease)    ? Hypertension      PSH as follows:   Past Surgical History:   Procedure Laterality Date   ? NO PRIOR SURGERIES         Social history:   Social History     Social History   ? Marital status: Married     Spouse name: N/A   ? Number of children: N/A   ? Years of education: N/A     Occupational History   ? Not on file.     Social History Main Topics   ? Smoking status: Never Smoker   ? Smokeless tobacco: Never Used   ? Alcohol use No   ? Drug use: No   ? Sexual activity: Yes     Partners: Female     Birth control/ protection: Surgical     Other Topics Concern   ? Not on file     Social History Narrative    ** Merged History Encounter **              Allergies: No Known Allergies    Review of Systems     ROS as it applies to the last 24 hours.    Skin: No rashes, No lumps, No itching, No jaundice, No moles.    Eyes: No blurred vision, No discomfort, No pain, No redness, No discharge, No cataracts.    Ears: No hearing deficit, No tinnitus, No ear pain, No discharge.     Nose: No stuffiness, No nose bleed.     M & T: No toothache, No bleeding gums, No swallowing difficulty, No sore throat.     Neck: No mass, No pain, No nuchal rigidity, No lymphadenopathy, No stiffness.     Resp: No cough, No hemoptysis, No SOB, No wheezing.    CV: No palpitations, No pain, No edema, No claudication, No varicose veins.    GI: No nausea, No vomiting, No anorexia, No heartburn, No constipation.     GU: No dysuria, No nocturia, No hematuria, No incontinence, No flank pain.    MS: No joint pain, No joint stiffness, No joint swelling, No back pain.

## 2016-10-14 NOTE — Progress Notes
Patient, Patrick Wade, 31 y.o. african [place] male, is here in clinic today for lab review and medication refill.  His labs were remarkable with exception of vitamin D, which is 7.1.  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  Educated patient on the importance of spending 10 minutes a day in the sun to absorb some vitamin D.  In addition, prescribed a Vitamin D oral supplement.  Historical medications refilled as he states he feels his sleep, blood pressure, GERD and allergies are well managed.  Patient advised to continue current medications as prescribed.  Patient is obese and diet and exercise discussed at length. Patient's wife is new diabetic and he is advised to follow her new diet with her.  Patient agrees to plan of care and verbalizes understanding.  Data Review:    BP (!) 138/95  - Pulse 52  - Temp 36.8 ?C (98.3 ?F)  - Resp 16  - Ht 1.88 m (6\' 2" )  - Wt (!) 141.1 kg (311 lb)  - BMI 39.93 kg/m?     Lab Results   Component Value Date    WBC 6.5 08/31/2016    WBC 8.06 01/22/2016    HGB 13.9 08/31/2016    HCT 44.3 08/31/2016    MCV 85 08/31/2016    MCV 84.5 07/09/2015       Lab Results   Component Value Date/Time    NA 143 08/31/2016 12:00 AM    K 4.8 08/31/2016 12:00 AM    CL 104 08/31/2016 12:00 AM    BUN 10 08/31/2016 12:00 AM    BUN 10 01/22/2016 05:27 AM    BUN 13 07/09/2015 08:53 AM    CREATININE 0.93 08/31/2016 12:00 AM    GLU 88 08/31/2016 12:00 AM    CALCIUM 9.2 08/31/2016 12:00 AM       Lab Results   Component Value Date/Time    AST 27 08/31/2016 12:00 AM    ALT 29 08/31/2016 12:00 AM       Lab Results   Component Value Date/Time    CHOL 148 08/31/2016 12:00 AM    CHOL 139 07/09/2015 08:53 AM    CHOL 167 07/02/2014 02:40 PM    TRIG 75 08/31/2016 12:00 AM    TRIG 63 07/09/2015 08:53 AM    TRIG 69 07/02/2014 02:40 PM    HDL 43 08/31/2016 12:00 AM    HDL 46 07/09/2015 08:53 AM    HDL 48 07/02/2014 02:40 PM    LDL 90 08/31/2016 12:00 AM

## 2016-10-14 NOTE — Progress Notes
No teaching statement needed

## 2016-10-14 NOTE — Patient Instructions
?   2018 Elsevier/Gold Standard (2007-09-05 17:48:44)

## 2016-10-14 NOTE — Progress Notes
Neuro: No fainting, No dizziness, No numbness, No tremor, No amnesia.     Heme/lymphatic: No anemia, No easy bruising, No easy bleeding.     Endocrine: No heat or cold intolerance, No polydipsia, No polyuria, No polyphagia.     General: No Fevers, No weight loss, No weakness, No night sweating.     Physical Exam     Constitution: Well developed, obese, appears stated age, ambulated from waiting area: in no distress      RESPIRATORY: Non labored breathing, symmetric chest wall movement, No wheezes, rales or rhonchi, chest resonant.      CV: nondisplaced PMI, RRR with normal S1/S2, no S3 or S4, no murmur: no JVD;  no abnormal pulsations,  no pedal edema      SKIN: warm with patients usual color.  No rashes or suspicious lesions noted.     NEUROLOGIC: CN: II-XII grossly intact.      Psyche: oriented to time, person, place; normal speech and content;  appropriate mood and affect;  interactive and responsive; memory, judgment, and insight are intact      Assessment     Encounter Diagnoses   Name Primary?   ? Body mass index 39.0-39.9, adult    ? Contact dermatitis due to metals, unspecified contact dermatitis type    ? Transient insomnia    ? Acute right ankle pain    ? Pain of right lower extremity    ? Essential hypertension    ? Chronic GERD    ? Seasonal allergic rhinitis, unspecified trigger    ? Vitamin D deficiency    ? B12 deficiency    ? Medication refill      Current Outpatient Prescriptions on File Prior to Visit   Medication Sig Dispense Refill   ? [DISCONTINUED] azelastine (ASTELIN) 0.1 % NA Solution Spray 1 spray in each nostril 2 times daily. Use in each nostril as directed 30 mL 3   ? [DISCONTINUED] cyanocobalamin (vitamin B-12) 1000 MCG PO Tablet Take 1 tablet by mouth daily.     ? [DISCONTINUED] esomeprazole (NexIUM) 40 MG PO Capsule Delayed Release Take 1 capsule by mouth every morning (30 min before breakfast). 30 capsule 5   ? [DISCONTINUED] ibuprofen (ADVIL,MOTRIN) 800 MG PO Tablet Take 1 tablet

## 2016-10-14 NOTE — Patient Instructions
This list may not describe all possible interactions. Give your health care provider a list of all the medicines, herbs, non-prescription drugs, or dietary supplements you use. Also tell them if you smoke, drink alcohol, or use illegal drugs. Some items may interact with your medicine.  What should I watch for while using this medicine?  Visit your doctor or health care professional for regular checks on your progress.  Do not take any non-prescription medicines that have vitamin D, phosphorus, magnesium, or calcium including antacids while taking this medicine, unless your doctor or health care professional says you can. The extra supplements can cause side effects.  What side effects may I notice from receiving this medicine?  Side effects that you should report to your doctor or health care professional as soon as possible:  -allergic reactions like skin rash, itching or hives, swelling of the face, lips, or tongue  -bone pain  -increased thirst  -increased urination (especially at night)  -irregular heartbeat, high blood pressure  -seizures  -unexpected weight loss  -unusually weak or tired  Side effects that usually do not require medical attention (report to your doctor or health care professional if they continue or are bothersome):  -constipation  -dry mouth  -headache  -loss of appetite  -metallic taste  -stomach upset  This list may not describe all possible side effects. Call your doctor for medical advice about side effects. You may report side effects to FDA at 1-800-FDA-1088.  Where should I keep my medicine?  Keep out of the reach of children.  Store at room temperature between 15 and 30 degrees C (59 and 86 degrees F). Protect from light. Keep container tightly closed. Throw away any unused medicine after the expiration date.  NOTE: This sheet is a summary. It may not cover all possible information. If you have questions about this medicine, talk to your doctor, pharmacist, or health care provider.

## 2016-10-14 NOTE — Patient Instructions
Ergocalciferol, Vitamin D2 tablets or capsules  What is this medicine?  ERGOCALCIFEROL (er goe kal SIF e role) is a man made form of vitamin D. It helps your body keep the right amount of calcium and phosphorus for healthy bones and teeth.  This medicine may be used for other purposes; ask your health care provider or pharmacist if you have questions.  COMMON BRAND NAME(S): Deltalin, Drisdol, Ergo D  What should I tell my health care provider before I take this medicine?  They need to know if you have any of the following conditions:  -kidney disease  -liver disease  -other chronic disease  -parathyroid disease  -stomach disease  -an unusual or allergic reaction to vitamin D, other medicines, foods, dyes, or preservatives  -pregnant or trying to get pregnant  -breast-feeding  How should I use this medicine?  Take this medicine by mouth with a glass of water. Follow the directions on the prescription label. Take your medicine at regular intervals. Do not take your medicine more often than directed.  Talk to your pediatrician regarding the use of this medicine in children. While this drug may be prescribed for children for selected conditions, precautions do apply.  Overdosage: If you think you have taken too much of this medicine contact a poison control center or emergency room at once.  NOTE: This medicine is only for you. Do not share this medicine with others.  What if I miss a dose?  If you miss a dose, take it as soon as you can. If it is almost time for your next dose, take only that dose. Do not take double or extra doses.  What may interact with this medicine?  Do not take this medicine with any of the following medications:  -vitamin D  This medicine may also interact with the following medications:  -digoxin  -diuretics  -medicines for cholesterol like colestipol or cholestyramine  -medicines to treat seizures or nerve pain  -mineral oil  -orlistat  -some over-the-counter supplements

## 2016-10-14 NOTE — Progress Notes
by mouth every 6 hours as needed for pain. 60 tablet 1   ? [DISCONTINUED] traZODone (DESYREL) 50 MG PO Tablet TAKE 1 TO 2 TABLETS BY MOUTH 1 HOUR BEFORE BEDTIME 60 tablet 0   ? [DISCONTINUED] triamcinolone (KENALOG) 0.1 % EX Ointment Apply topically 2 times daily. Apply topically 2 times daily prn 454 g 2   ? [DISCONTINUED] hydroCHLOROthiazide (HYDRODIURIL) 25 MG Tablet Take 1 tablet by mouth daily. 30 tablet 3   ? lisinopril (PRINIVIL,ZESTRIL) 10 MG Tablet Take by mouth daily.     ? losartan (COZAAR) 25 MG Tablet Take 1 tablet by mouth daily. 30 tablet 3   ? sildenafil (VIAGRA) 100 MG Tablet Take 1 tablet by mouth as needed for erectile dysfunction. 10 tablet 0   ? tadalafil (CIALIS) 20 MG Tablet Take 1 tablet by mouth as needed for erectile dysfunction. Take 1/2 to 1 tab by mouth as needed for erectile dysfunction. 6 tablet 1     No current facility-administered medications on file prior to visit.        Plan     Patient ordered:   Vitamin D- low vitamin D 7.1  Patient advised to be vigilant and to report any further worsening symptoms  If symptoms continue or worsen, please seek treatment at ED.      Reviewed previous labs/diagnostics/notes with patient.   F/u  as scheduled or sooner as needed.    Discussed plan of care with patient.  Patient is also advised to various treatment options and  agreed to the following: taking medication as directed, keeping follow up  visits.  Risks verses benefits of medication and treatment were discussed.   Patient is instructed to  increase and maintain  activity level, to adopt a healthy diet.  Patient voiced  agreement  and understanding.      M. Willa RoughHicks, DNP

## 2017-01-10 DIAGNOSIS — F5102 Adjustment insomnia: Principal | ICD-10-CM

## 2017-01-11 MED ORDER — TRAZODONE HCL 50 MG PO TABS
0 refills | Status: CP
Start: 2017-01-11 — End: ?

## 2017-03-04 DIAGNOSIS — M25571 Pain in right ankle and joints of right foot: Principal | ICD-10-CM

## 2017-03-04 DIAGNOSIS — M79604 Pain in right leg: Secondary | ICD-10-CM

## 2017-03-04 MED ORDER — IBUPROFEN 800 MG PO TABS
800 mg | Freq: Four times a day (QID) | ORAL | 1 refills | Status: CP | PRN
Start: 2017-03-04 — End: ?

## 2017-05-23 DIAGNOSIS — E559 Vitamin D deficiency, unspecified: Principal | ICD-10-CM

## 2017-05-24 MED ORDER — VITAMIN D (ERGOCALCIFEROL) 1.25 MG (50000 UT) PO CAPS
50000 [IU] | ORAL | 1 refills | Status: CP
Start: 2017-05-24 — End: ?

## 2017-08-26 ENCOUNTER — Inpatient Hospital Stay

## 2017-09-15 DIAGNOSIS — R2242 Localized swelling, mass and lump, left lower limb: Secondary | ICD-10-CM

## 2017-09-15 DIAGNOSIS — M00869 Arthritis due to other bacteria, unspecified knee: Secondary | ICD-10-CM

## 2017-09-15 DIAGNOSIS — M25562 Pain in left knee: Secondary | ICD-10-CM

## 2017-09-15 DIAGNOSIS — N179 Acute kidney failure, unspecified: Secondary | ICD-10-CM

## 2017-09-15 DIAGNOSIS — D649 Anemia, unspecified: Principal | ICD-10-CM

## 2017-09-15 DIAGNOSIS — B952 Enterococcus as the cause of diseases classified elsewhere: Secondary | ICD-10-CM

## 2017-09-15 DIAGNOSIS — Z531 Procedure and treatment not carried out because of patient's decision for reasons of belief and group pressure: Secondary | ICD-10-CM

## 2017-09-15 DIAGNOSIS — Z9889 Other specified postprocedural states: Secondary | ICD-10-CM

## 2018-01-11 DIAGNOSIS — L230 Allergic contact dermatitis due to metals: Principal | ICD-10-CM

## 2018-01-11 MED ORDER — TRIAMCINOLONE ACETONIDE 0.1 % EX OINT
2 refills | Status: CP
Start: 2018-01-11 — End: ?

## 2018-01-12 IMAGING — CR DG ANKLE COMPLETE 3+V*R*
3 series · 3 of 3 positions shown · non-contrast
Comparison: None.

CLINICAL DATA: Acute right ankle pain without known injury.

EXAM:
RIGHT ANKLE - COMPLETE 3+ VIEW

[x ankle lat right]
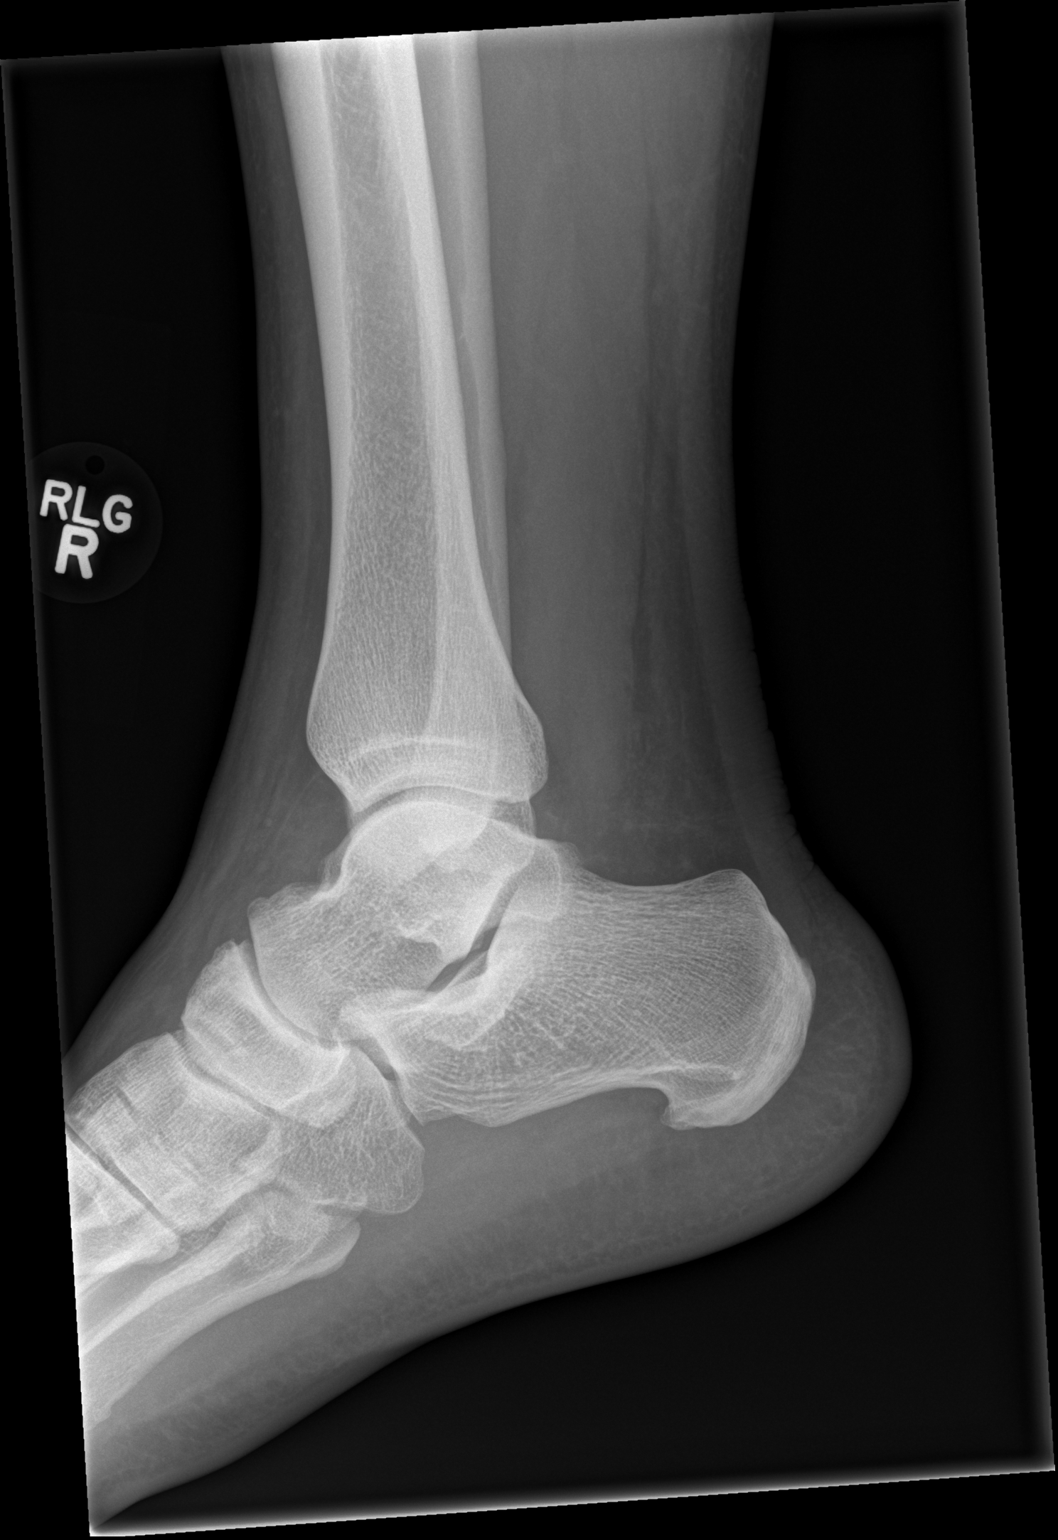

[x ankle ap right]
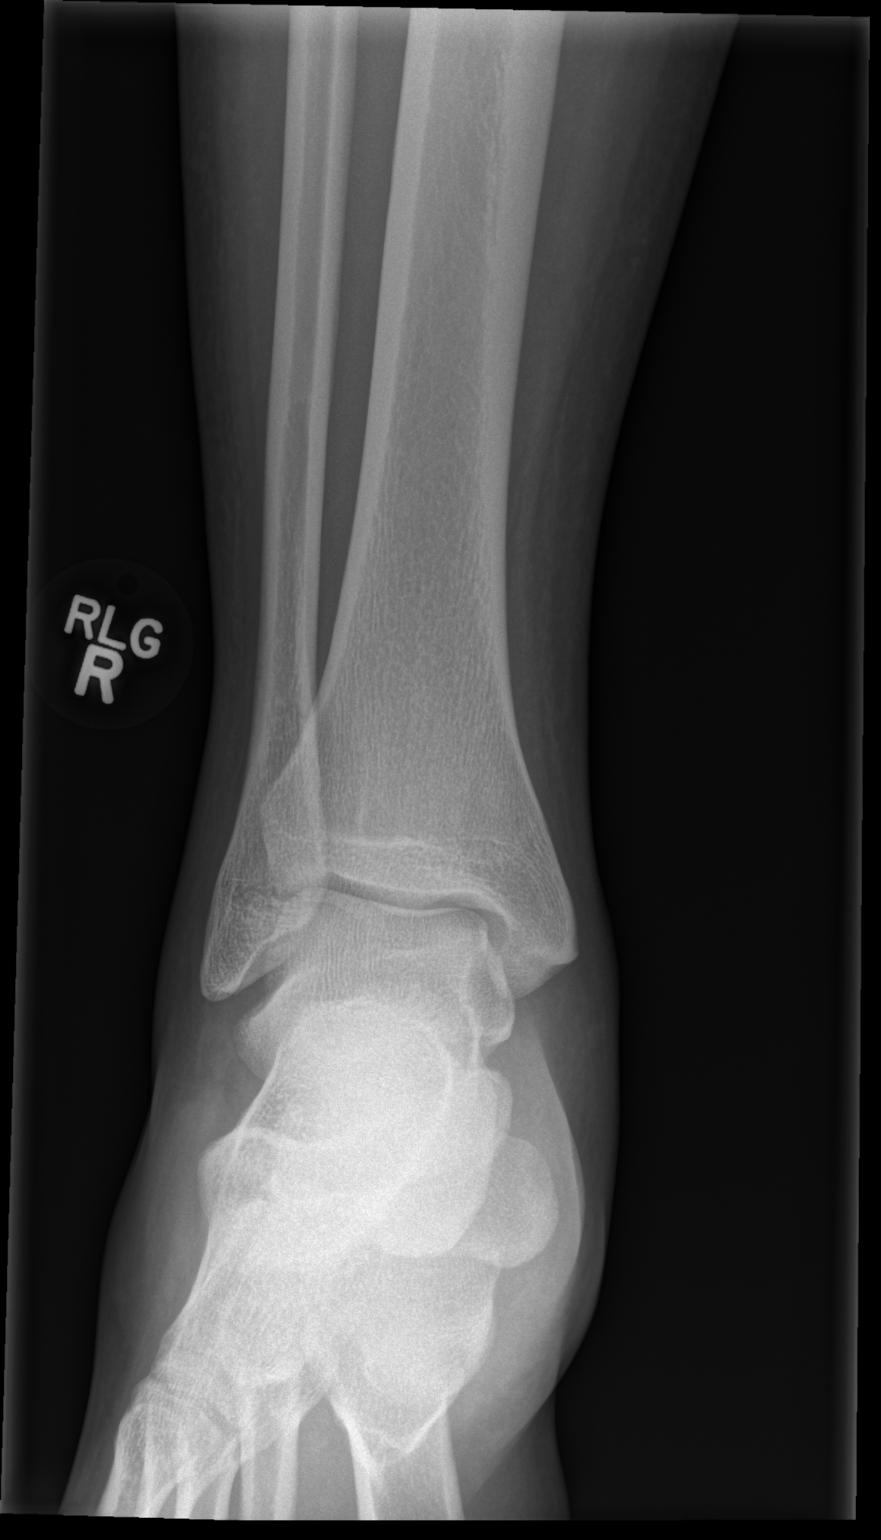

[x ankle obl right]
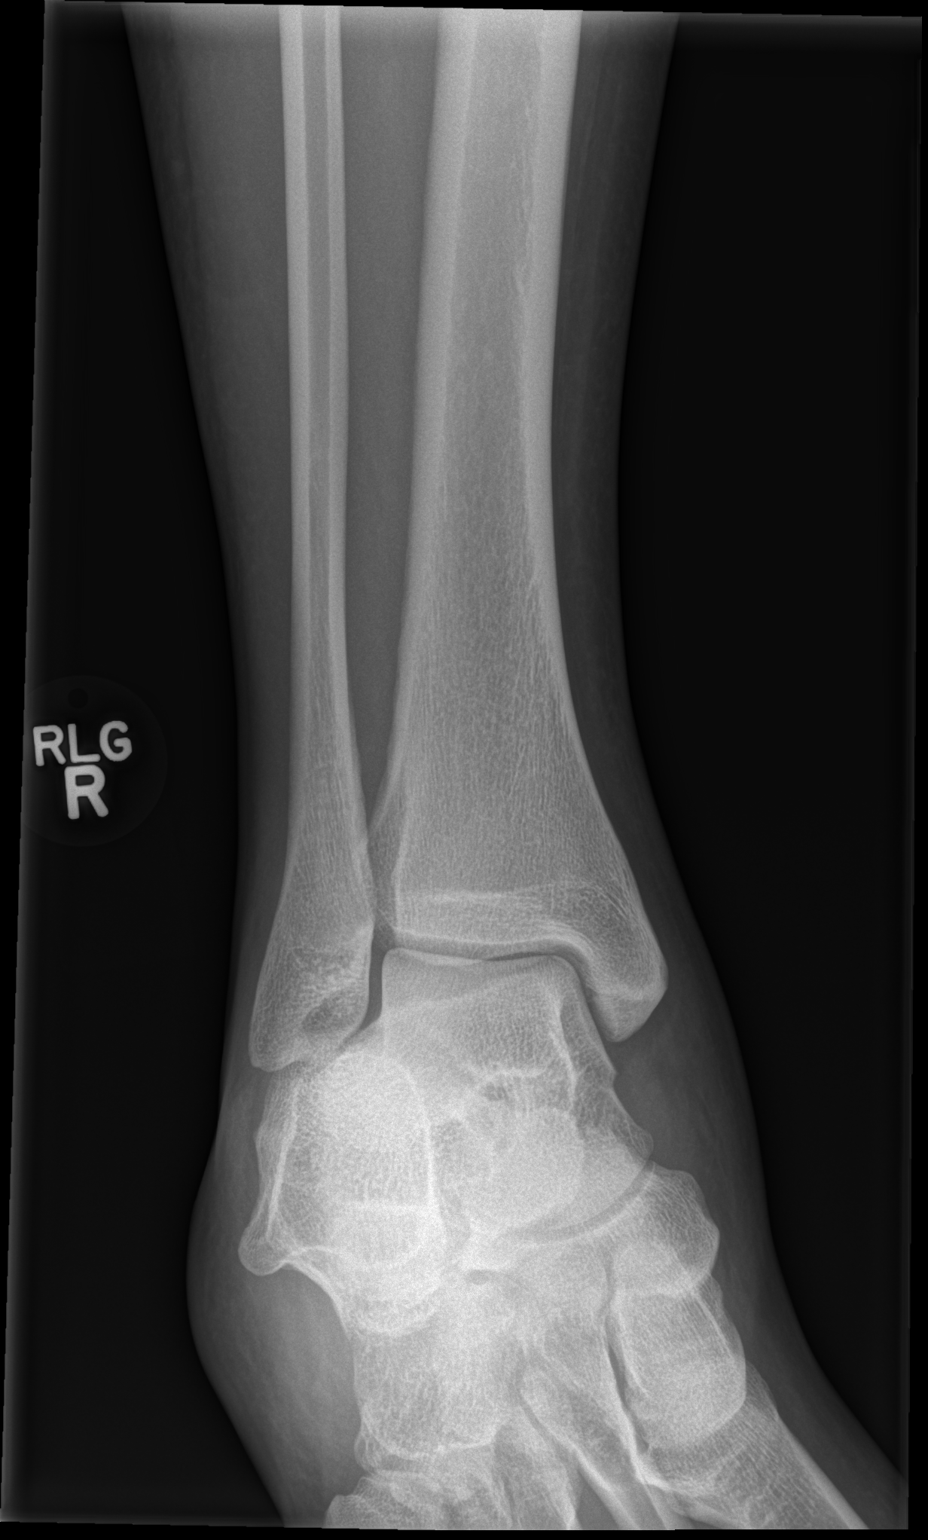

[3 of 3 positions shown; findings below may reference images not displayed]

FINDINGS: There is no evidence of fracture, dislocation, or joint effusion.
There is no evidence of arthropathy or other focal bone abnormality.
Soft tissues are unremarkable.
IMPRESSION: Normal right ankle.

## 2019-07-28 ENCOUNTER — Encounter: Attending: Family | Primary: Family

## 2019-07-31 ENCOUNTER — Encounter: Payer: MEDICAID | Primary: Family

## 2019-07-31 ENCOUNTER — Ambulatory Visit: Primary: Family

## 2019-07-31 ENCOUNTER — Ambulatory Visit: Payer: MEDICAID | Attending: Family | Primary: Family

## 2019-07-31 DIAGNOSIS — Z760 Encounter for issue of repeat prescription: Secondary | ICD-10-CM

## 2019-07-31 DIAGNOSIS — R1010 Upper abdominal pain, unspecified: Secondary | ICD-10-CM

## 2019-07-31 DIAGNOSIS — Z6841 Body Mass Index (BMI) 40.0 and over, adult: Secondary | ICD-10-CM

## 2019-07-31 DIAGNOSIS — Z6839 Body mass index (BMI) 39.0-39.9, adult: Secondary | ICD-10-CM

## 2019-07-31 DIAGNOSIS — E538 Deficiency of other specified B group vitamins: Secondary | ICD-10-CM

## 2019-07-31 DIAGNOSIS — Z9889 Other specified postprocedural states: Secondary | ICD-10-CM

## 2019-07-31 DIAGNOSIS — R7301 Impaired fasting glucose: Secondary | ICD-10-CM

## 2019-07-31 DIAGNOSIS — E559 Vitamin D deficiency, unspecified: Secondary | ICD-10-CM

## 2019-07-31 DIAGNOSIS — I10 Essential (primary) hypertension: Secondary | ICD-10-CM

## 2019-07-31 DIAGNOSIS — M79604 Pain in right leg: Secondary | ICD-10-CM

## 2019-07-31 DIAGNOSIS — R079 Chest pain, unspecified: Secondary | ICD-10-CM

## 2019-07-31 DIAGNOSIS — Z1329 Encounter for screening for other suspected endocrine disorder: Secondary | ICD-10-CM

## 2019-07-31 DIAGNOSIS — Z8249 Family history of ischemic heart disease and other diseases of the circulatory system: Secondary | ICD-10-CM

## 2019-07-31 DIAGNOSIS — M25562 Pain in left knee: Secondary | ICD-10-CM

## 2019-07-31 DIAGNOSIS — L230 Allergic contact dermatitis due to metals: Secondary | ICD-10-CM

## 2019-07-31 DIAGNOSIS — Z0000 Encounter for general adult medical examination without abnormal findings: Secondary | ICD-10-CM

## 2019-07-31 DIAGNOSIS — Z823 Family history of stroke: Secondary | ICD-10-CM

## 2019-07-31 DIAGNOSIS — M25571 Pain in right ankle and joints of right foot: Principal | ICD-10-CM

## 2019-07-31 DIAGNOSIS — G4733 Obstructive sleep apnea (adult) (pediatric): Secondary | ICD-10-CM

## 2019-07-31 DIAGNOSIS — E291 Testicular hypofunction: Secondary | ICD-10-CM

## 2019-07-31 DIAGNOSIS — Z833 Family history of diabetes mellitus: Secondary | ICD-10-CM

## 2019-07-31 DIAGNOSIS — K219 Gastro-esophageal reflux disease without esophagitis: Secondary | ICD-10-CM

## 2019-07-31 DIAGNOSIS — E119 Type 2 diabetes mellitus without complications: Secondary | ICD-10-CM

## 2019-07-31 DIAGNOSIS — M25561 Pain in right knee: Secondary | ICD-10-CM

## 2019-07-31 DIAGNOSIS — Z1159 Encounter for screening for other viral diseases: Secondary | ICD-10-CM

## 2019-07-31 DIAGNOSIS — G8929 Other chronic pain: Secondary | ICD-10-CM

## 2019-07-31 DIAGNOSIS — F5102 Adjustment insomnia: Secondary | ICD-10-CM

## 2019-07-31 DIAGNOSIS — I498 Other specified cardiac arrhythmias: Secondary | ICD-10-CM

## 2019-07-31 DIAGNOSIS — J302 Other seasonal allergic rhinitis: Secondary | ICD-10-CM

## 2019-07-31 DIAGNOSIS — G4700 Insomnia, unspecified: Secondary | ICD-10-CM

## 2019-07-31 MED ORDER — LISINOPRIL 20 MG PO TABS
20 mg | Freq: Every day | ORAL | 0 refills | Status: CP
Start: 2019-07-31 — End: ?

## 2019-07-31 MED ORDER — METFORMIN HCL 500 MG PO TABS
500 mg | Freq: Two times a day (BID) | ORAL | 0 refills | 90.00000 days | Status: CP
Start: 2019-07-31 — End: ?

## 2019-08-01 DIAGNOSIS — E559 Vitamin D deficiency, unspecified: Principal | ICD-10-CM

## 2019-08-01 MED ORDER — ERGOCALCIFEROL 1.25 MG (50000 UT) PO CAPS
1.25 mg | ORAL | 3 refills | 28.00000 days | Status: CP
Start: 2019-08-01 — End: ?

## 2019-08-11 ENCOUNTER — Encounter: Payer: MEDICAID | Attending: Family | Primary: Family

## 2019-08-11 DIAGNOSIS — E291 Testicular hypofunction: Secondary | ICD-10-CM

## 2019-08-11 DIAGNOSIS — E559 Vitamin D deficiency, unspecified: Secondary | ICD-10-CM

## 2019-08-11 DIAGNOSIS — Z0000 Encounter for general adult medical examination without abnormal findings: Secondary | ICD-10-CM

## 2019-08-11 DIAGNOSIS — E119 Type 2 diabetes mellitus without complications: Secondary | ICD-10-CM

## 2019-08-14 ENCOUNTER — Encounter: Payer: MEDICAID | Attending: Family | Primary: Family

## 2019-08-24 DIAGNOSIS — I10 Essential (primary) hypertension: Secondary | ICD-10-CM

## 2019-08-24 DIAGNOSIS — E119 Type 2 diabetes mellitus without complications: Principal | ICD-10-CM

## 2019-08-25 ENCOUNTER — Ambulatory Visit: Payer: MEDICAID | Attending: Family | Primary: Family

## 2019-08-25 DIAGNOSIS — R7989 Other specified abnormal findings of blood chemistry: Secondary | ICD-10-CM

## 2019-08-25 DIAGNOSIS — E119 Type 2 diabetes mellitus without complications: Principal | ICD-10-CM

## 2019-08-25 DIAGNOSIS — L2481 Irritant contact dermatitis due to metals: Secondary | ICD-10-CM

## 2019-08-25 DIAGNOSIS — E559 Vitamin D deficiency, unspecified: Secondary | ICD-10-CM

## 2019-08-25 DIAGNOSIS — K219 Gastro-esophageal reflux disease without esophagitis: Secondary | ICD-10-CM

## 2019-08-25 DIAGNOSIS — Z6841 Body Mass Index (BMI) 40.0 and over, adult: Secondary | ICD-10-CM

## 2019-08-25 DIAGNOSIS — I10 Essential (primary) hypertension: Secondary | ICD-10-CM

## 2019-08-25 MED ORDER — GABAPENTIN 300 MG PO CAPS: Start: 2019-08-25 — End: ?

## 2019-08-25 MED ORDER — TRIAMCINOLONE ACETONIDE 0.1 % EX OINT
1 | Freq: Two times a day (BID) | TOPICAL | 2 refills | 14.00000 days | Status: CP
Start: 2019-08-25 — End: ?

## 2019-08-25 MED ORDER — METFORMIN HCL 500 MG PO TABS
0 refills | Status: CP
Start: 2019-08-25 — End: 2019-08-25

## 2019-08-25 MED ORDER — TESTOSTERONE CYPIONATE 200 MG/ML IM SOLN
200 mg | INTRAMUSCULAR | 1 refills | Status: CP
Start: 2019-08-25 — End: ?

## 2019-08-25 MED ORDER — LISINOPRIL 20 MG PO TABS
0 refills | Status: CP
Start: 2019-08-25 — End: 2019-08-25

## 2019-08-25 MED ORDER — TRIAMCINOLONE ACETONIDE 0.1 % EX CREA
Freq: Two times a day (BID) | TOPICAL
Start: 2019-08-25 — End: 2019-08-25

## 2019-08-25 MED ORDER — METFORMIN HCL 500 MG PO TABS
500 mg | Freq: Two times a day (BID) | ORAL | 0 refills | Status: CP
Start: 2019-08-25 — End: ?

## 2019-08-25 MED ORDER — CLONIDINE HCL 0.1 MG PO TABS
0.1 mg | Freq: Once | ORAL | Status: CP
Start: 2019-08-25 — End: ?

## 2019-08-25 MED ORDER — CYCLOBENZAPRINE HCL 10 MG PO TABS: Start: 2019-08-25 — End: ?

## 2019-08-25 MED ORDER — LOSARTAN POTASSIUM 50 MG PO TABS
50 mg | Freq: Two times a day (BID) | ORAL | 2 refills | 90.00000 days | Status: CP
Start: 2019-08-25 — End: ?

## 2019-08-25 MED ORDER — TRIAMCINOLONE ACETONIDE 0.1 % EX OINT
1 | Freq: Two times a day (BID) | TOPICAL
Start: 2019-08-25 — End: 2019-08-25

## 2019-08-29 ENCOUNTER — Ambulatory Visit: Payer: MEDICAID | Attending: Family Medicine | Primary: Family

## 2019-08-29 DIAGNOSIS — R7989 Other specified abnormal findings of blood chemistry: Principal | ICD-10-CM

## 2019-08-29 MED ORDER — TESTOSTERONE CYPIONATE 200 MG/ML IM SOLN
50 mg | Freq: Once | INTRAMUSCULAR | Status: CP
Start: 2019-08-29 — End: ?

## 2019-09-19 DIAGNOSIS — E119 Type 2 diabetes mellitus without complications: Secondary | ICD-10-CM

## 2019-09-19 DIAGNOSIS — I10 Essential (primary) hypertension: Principal | ICD-10-CM

## 2019-09-20 MED ORDER — METFORMIN HCL 500 MG PO TABS
ORAL | 0 refills | 90.00000 days | Status: CP
Start: 2019-09-20 — End: ?

## 2019-09-20 MED ORDER — LISINOPRIL 20 MG PO TABS
0 refills
Start: 2019-09-20 — End: ?

## 2019-09-22 ENCOUNTER — Ambulatory Visit: Admit: 2019-09-22 | Discharge: 2019-09-22 | Attending: Family

## 2019-09-22 DIAGNOSIS — I10 Essential (primary) hypertension: Principal | ICD-10-CM

## 2019-09-22 DIAGNOSIS — Z0000 Encounter for general adult medical examination without abnormal findings: Secondary | ICD-10-CM

## 2019-09-22 DIAGNOSIS — K219 Gastro-esophageal reflux disease without esophagitis: Secondary | ICD-10-CM

## 2019-09-22 DIAGNOSIS — Z6841 Body Mass Index (BMI) 40.0 and over, adult: Principal | ICD-10-CM

## 2019-09-22 MED ORDER — ONDANSETRON HCL 4 MG PO TABS: Start: 2019-09-22 — End: ?

## 2019-09-22 MED ORDER — TRAMADOL HCL 50 MG PO TABS: Start: 2019-09-22 — End: ?

## 2019-09-22 MED ORDER — ACETAMINOPHEN EXTRA STRENGTH 500 MG PO TABS
ORAL_TABLET
Start: 2019-09-22 — End: ?

## 2019-09-22 MED ORDER — LISINOPRIL 20 MG PO TABS: Start: 2019-09-22 — End: 2019-09-22

## 2019-10-10 ENCOUNTER — Ambulatory Visit: Admit: 2019-10-10 | Discharge: 2019-10-10 | Attending: Family

## 2019-10-10 DIAGNOSIS — R7989 Other specified abnormal findings of blood chemistry: Principal | ICD-10-CM

## 2019-10-10 MED ORDER — NEEDLE (DISP) 21G X 1-1/2" MISC
0 refills | Status: CP
Start: 2019-10-10 — End: ?

## 2019-10-10 MED ORDER — SYRINGE (DISPOSABLE) 3 ML MISC
0 refills | Status: CP
Start: 2019-10-10 — End: ?

## 2019-10-10 MED ORDER — TESTOSTERONE CYPIONATE 200 MG/ML IM SOLN
200 mg | Freq: Once | INTRAMUSCULAR | Status: CP
Start: 2019-10-10 — End: ?

## 2019-10-10 MED ORDER — NEEDLE (DISP) 18G X 1-1/2" MISC
0 refills | Status: CP
Start: 2019-10-10 — End: ?

## 2019-10-12 DIAGNOSIS — E119 Type 2 diabetes mellitus without complications: Principal | ICD-10-CM

## 2019-10-12 MED ORDER — METFORMIN HCL 500 MG PO TABS
0 refills | Status: CP
Start: 2019-10-12 — End: ?

## 2019-11-18 DIAGNOSIS — I10 Essential (primary) hypertension: Principal | ICD-10-CM

## 2019-11-21 MED ORDER — LOSARTAN POTASSIUM 50 MG PO TABS
50 mg | Freq: Two times a day (BID) | ORAL | 1 refills | Status: CP
Start: 2019-11-21 — End: ?

## 2019-12-01 DIAGNOSIS — E119 Type 2 diabetes mellitus without complications: Principal | ICD-10-CM

## 2019-12-01 MED ORDER — METFORMIN HCL 500 MG PO TABS
500 mg | Freq: Two times a day (BID) | ORAL | 0 refills | 90.00000 days | Status: CP
Start: 2019-12-01 — End: ?

## 2019-12-02 DIAGNOSIS — E119 Type 2 diabetes mellitus without complications: Principal | ICD-10-CM

## 2019-12-05 MED ORDER — METFORMIN HCL 500 MG PO TABS
0 refills | Status: CP
Start: 2019-12-05 — End: ?

## 2019-12-18 ENCOUNTER — Ambulatory Visit: Admit: 2019-12-18 | Discharge: 2019-12-19 | Payer: Medicare Other | Attending: Physician Assistant | Primary: Family

## 2019-12-18 ENCOUNTER — Inpatient Hospital Stay: Admit: 2019-12-18 | Discharge: 2019-12-19 | Payer: Medicare Other | Primary: Family

## 2019-12-18 DIAGNOSIS — S86812S Strain of other muscle(s) and tendon(s) at lower leg level, left leg, sequela: Principal | ICD-10-CM

## 2019-12-18 DIAGNOSIS — M2341 Loose body in knee, right knee: Secondary | ICD-10-CM

## 2019-12-18 DIAGNOSIS — K219 Gastro-esophageal reflux disease without esophagitis: Secondary | ICD-10-CM

## 2019-12-18 DIAGNOSIS — S86811S Strain of other muscle(s) and tendon(s) at lower leg level, right leg, sequela: Secondary | ICD-10-CM

## 2019-12-18 DIAGNOSIS — G8929 Other chronic pain: Secondary | ICD-10-CM

## 2019-12-18 DIAGNOSIS — M25561 Pain in right knee: Principal | ICD-10-CM

## 2019-12-18 DIAGNOSIS — I10 Essential (primary) hypertension: Principal | ICD-10-CM

## 2019-12-18 DIAGNOSIS — M25562 Pain in left knee: Secondary | ICD-10-CM

## 2019-12-22 DIAGNOSIS — K219 Gastro-esophageal reflux disease without esophagitis: Secondary | ICD-10-CM

## 2019-12-22 DIAGNOSIS — E119 Type 2 diabetes mellitus without complications: Principal | ICD-10-CM

## 2019-12-22 DIAGNOSIS — I10 Essential (primary) hypertension: Secondary | ICD-10-CM

## 2019-12-22 DIAGNOSIS — Z6841 Body Mass Index (BMI) 40.0 and over, adult: Secondary | ICD-10-CM

## 2019-12-22 DIAGNOSIS — E559 Vitamin D deficiency, unspecified: Secondary | ICD-10-CM

## 2019-12-22 DIAGNOSIS — E291 Testicular hypofunction: Secondary | ICD-10-CM

## 2019-12-22 MED ORDER — TESTOSTERONE CYPIONATE 200 MG/ML IM SOLN
200 mg | Freq: Once | INTRAMUSCULAR | Status: CP
Start: 2019-12-22 — End: ?

## 2019-12-30 ENCOUNTER — Inpatient Hospital Stay: Admit: 2019-12-30 | Discharge: 2019-12-31 | Payer: Medicare Other | Primary: Family

## 2019-12-30 DIAGNOSIS — M25561 Pain in right knee: Principal | ICD-10-CM

## 2019-12-30 DIAGNOSIS — S86812S Strain of other muscle(s) and tendon(s) at lower leg level, left leg, sequela: Secondary | ICD-10-CM

## 2019-12-30 DIAGNOSIS — G8929 Other chronic pain: Secondary | ICD-10-CM

## 2019-12-30 DIAGNOSIS — M25562 Pain in left knee: Secondary | ICD-10-CM

## 2019-12-30 DIAGNOSIS — S86811S Strain of other muscle(s) and tendon(s) at lower leg level, right leg, sequela: Principal | ICD-10-CM

## 2019-12-30 DIAGNOSIS — M2341 Loose body in knee, right knee: Secondary | ICD-10-CM

## 2020-01-02 DIAGNOSIS — E119 Type 2 diabetes mellitus without complications: Principal | ICD-10-CM

## 2020-01-02 MED ORDER — METFORMIN HCL 500 MG PO TABS
0 refills | 90.00 days | Status: CP
Start: 2020-01-02 — End: ?

## 2020-02-02 DIAGNOSIS — E119 Type 2 diabetes mellitus without complications: Principal | ICD-10-CM

## 2020-02-02 MED ORDER — METFORMIN HCL 500 MG PO TABS
0 refills | 60.00 days | Status: CP
Start: 2020-02-02 — End: ?

## 2020-02-05 DIAGNOSIS — E119 Type 2 diabetes mellitus without complications: Principal | ICD-10-CM

## 2020-02-05 MED ORDER — METFORMIN HCL 500 MG PO TABS
500 mg | Freq: Two times a day (BID) | ORAL | 0 refills | 90.00 days | Status: CP
Start: 2020-02-05 — End: ?

## 2020-02-09 DIAGNOSIS — Z6841 Body Mass Index (BMI) 40.0 and over, adult: Secondary | ICD-10-CM

## 2020-02-09 DIAGNOSIS — F5102 Adjustment insomnia: Secondary | ICD-10-CM

## 2020-02-09 DIAGNOSIS — R109 Unspecified abdominal pain: Secondary | ICD-10-CM

## 2020-02-09 DIAGNOSIS — E291 Testicular hypofunction: Secondary | ICD-10-CM

## 2020-02-09 DIAGNOSIS — I10 Essential (primary) hypertension: Secondary | ICD-10-CM

## 2020-02-09 DIAGNOSIS — E559 Vitamin D deficiency, unspecified: Secondary | ICD-10-CM

## 2020-02-09 DIAGNOSIS — Z760 Encounter for issue of repeat prescription: Secondary | ICD-10-CM

## 2020-02-09 DIAGNOSIS — G4733 Obstructive sleep apnea (adult) (pediatric): Secondary | ICD-10-CM

## 2020-02-09 DIAGNOSIS — Z8249 Family history of ischemic heart disease and other diseases of the circulatory system: Secondary | ICD-10-CM

## 2020-02-09 DIAGNOSIS — Z1159 Encounter for screening for other viral diseases: Secondary | ICD-10-CM

## 2020-02-09 DIAGNOSIS — Z823 Family history of stroke: Secondary | ICD-10-CM

## 2020-02-09 DIAGNOSIS — K219 Gastro-esophageal reflux disease without esophagitis: Secondary | ICD-10-CM

## 2020-02-09 DIAGNOSIS — M79604 Pain in right leg: Secondary | ICD-10-CM

## 2020-02-09 DIAGNOSIS — R1030 Lower abdominal pain, unspecified: Secondary | ICD-10-CM

## 2020-02-09 DIAGNOSIS — Z0000 Encounter for general adult medical examination without abnormal findings: Secondary | ICD-10-CM

## 2020-02-09 DIAGNOSIS — R7989 Other specified abnormal findings of blood chemistry: Secondary | ICD-10-CM

## 2020-02-09 DIAGNOSIS — E119 Type 2 diabetes mellitus without complications: Secondary | ICD-10-CM

## 2020-02-09 DIAGNOSIS — Z833 Family history of diabetes mellitus: Secondary | ICD-10-CM

## 2020-02-09 DIAGNOSIS — Z9889 Other specified postprocedural states: Secondary | ICD-10-CM

## 2020-02-09 DIAGNOSIS — M25571 Pain in right ankle and joints of right foot: Principal | ICD-10-CM

## 2020-02-09 DIAGNOSIS — E538 Deficiency of other specified B group vitamins: Secondary | ICD-10-CM

## 2020-02-09 DIAGNOSIS — Z6839 Body mass index (BMI) 39.0-39.9, adult: Secondary | ICD-10-CM

## 2020-02-09 DIAGNOSIS — R7301 Impaired fasting glucose: Secondary | ICD-10-CM

## 2020-02-09 MED ORDER — OMEPRAZOLE MAGNESIUM 20 MG PO TBEC
20 mg | Freq: Every day | ORAL | 0 refills | 60.00000 days | Status: CP
Start: 2020-02-09 — End: ?

## 2020-02-09 MED ORDER — TESTOSTERONE CYPIONATE 200 MG/ML IM SOLN
50 mg | Freq: Once | INTRAMUSCULAR | Status: CP
Start: 2020-02-09 — End: ?

## 2020-02-09 MED ORDER — DULOXETINE HCL 30 MG PO CPEP
30 mg | Freq: Every day | ORAL | 0 refills | Status: CP
Start: 2020-02-09 — End: ?

## 2020-02-09 MED ORDER — CLONIDINE HCL 0.1 MG PO TABS
0.1 mg | Freq: Once | ORAL | Status: CP
Start: 2020-02-09 — End: ?

## 2020-03-05 MED ORDER — DULOXETINE HCL 30 MG PO CPEP
0 refills | Status: CP
Start: 2020-03-05 — End: ?

## 2020-03-25 MED ORDER — DULOXETINE HCL 30 MG PO CPEP
30 mg | Freq: Every day | ORAL | 1 refills | Status: CP
Start: 2020-03-25 — End: ?

## 2020-04-29 DIAGNOSIS — E119 Type 2 diabetes mellitus without complications: Principal | ICD-10-CM

## 2020-04-29 MED ORDER — METFORMIN HCL 500 MG PO TABS
500 mg | Freq: Two times a day (BID) | ORAL | 0 refills | Status: CP
Start: 2020-04-29 — End: ?

## 2020-05-05 DIAGNOSIS — E119 Type 2 diabetes mellitus without complications: Principal | ICD-10-CM

## 2020-05-06 MED ORDER — METFORMIN HCL 500 MG PO TABS
0 refills | 45.00 days | Status: CP
Start: 2020-05-06 — End: ?

## 2020-05-15 ENCOUNTER — Ambulatory Visit: Payer: Medicare Other | Attending: Family | Primary: Family

## 2020-05-15 DIAGNOSIS — I10 Essential (primary) hypertension: Principal | ICD-10-CM

## 2020-05-15 DIAGNOSIS — R1310 Dysphagia, unspecified: Secondary | ICD-10-CM

## 2020-05-15 DIAGNOSIS — K219 Gastro-esophageal reflux disease without esophagitis: Secondary | ICD-10-CM

## 2020-05-15 DIAGNOSIS — R10816 Epigastric abdominal tenderness: Secondary | ICD-10-CM

## 2020-05-15 DIAGNOSIS — R1030 Lower abdominal pain, unspecified: Principal | ICD-10-CM

## 2020-05-15 DIAGNOSIS — Z8371 Family history of colonic polyps: Secondary | ICD-10-CM

## 2020-05-15 MED ORDER — PEG 3350-KCL-NABCB-NACL-NASULF 236 G PO SOLR
0 refills | Status: CP
Start: 2020-05-15 — End: ?

## 2020-05-16 ENCOUNTER — Inpatient Hospital Stay: Primary: Family

## 2020-05-20 ENCOUNTER — Encounter: Payer: Medicare Other | Primary: Family

## 2020-05-20 ENCOUNTER — Ambulatory Visit: Payer: Medicare Other | Attending: Family | Primary: Family

## 2020-05-20 ENCOUNTER — Ambulatory Visit: Primary: Family

## 2020-05-20 DIAGNOSIS — Z833 Family history of diabetes mellitus: Secondary | ICD-10-CM

## 2020-05-20 DIAGNOSIS — E538 Deficiency of other specified B group vitamins: Secondary | ICD-10-CM

## 2020-05-20 DIAGNOSIS — R1030 Lower abdominal pain, unspecified: Principal | ICD-10-CM

## 2020-05-20 DIAGNOSIS — R7301 Impaired fasting glucose: Secondary | ICD-10-CM

## 2020-05-20 DIAGNOSIS — R109 Unspecified abdominal pain: Secondary | ICD-10-CM

## 2020-05-20 DIAGNOSIS — K219 Gastro-esophageal reflux disease without esophagitis: Secondary | ICD-10-CM

## 2020-05-20 DIAGNOSIS — E559 Vitamin D deficiency, unspecified: Secondary | ICD-10-CM

## 2020-05-20 DIAGNOSIS — K1379 Other lesions of oral mucosa: Secondary | ICD-10-CM

## 2020-05-20 DIAGNOSIS — E291 Testicular hypofunction: Secondary | ICD-10-CM

## 2020-05-20 DIAGNOSIS — Z9889 Other specified postprocedural states: Secondary | ICD-10-CM

## 2020-05-20 DIAGNOSIS — M9689 Other intraoperative and postprocedural complications and disorders of the musculoskeletal system: Secondary | ICD-10-CM

## 2020-05-20 DIAGNOSIS — E119 Type 2 diabetes mellitus without complications: Secondary | ICD-10-CM

## 2020-05-20 DIAGNOSIS — Z760 Encounter for issue of repeat prescription: Secondary | ICD-10-CM

## 2020-05-20 DIAGNOSIS — Z1159 Encounter for screening for other viral diseases: Secondary | ICD-10-CM

## 2020-05-20 DIAGNOSIS — F5102 Adjustment insomnia: Secondary | ICD-10-CM

## 2020-05-20 DIAGNOSIS — R7989 Other specified abnormal findings of blood chemistry: Secondary | ICD-10-CM

## 2020-05-20 DIAGNOSIS — Z6841 Body Mass Index (BMI) 40.0 and over, adult: Secondary | ICD-10-CM

## 2020-05-20 DIAGNOSIS — I10 Essential (primary) hypertension: Secondary | ICD-10-CM

## 2020-05-20 DIAGNOSIS — G4700 Insomnia, unspecified: Secondary | ICD-10-CM

## 2020-05-20 DIAGNOSIS — G4733 Obstructive sleep apnea (adult) (pediatric): Secondary | ICD-10-CM

## 2020-05-20 DIAGNOSIS — N5201 Erectile dysfunction due to arterial insufficiency: Secondary | ICD-10-CM

## 2020-05-20 DIAGNOSIS — Z8371 Family history of colonic polyps: Secondary | ICD-10-CM

## 2020-05-20 DIAGNOSIS — Z1329 Encounter for screening for other suspected endocrine disorder: Secondary | ICD-10-CM

## 2020-05-20 DIAGNOSIS — R1310 Dysphagia, unspecified: Secondary | ICD-10-CM

## 2020-05-20 DIAGNOSIS — J302 Other seasonal allergic rhinitis: Secondary | ICD-10-CM

## 2020-05-20 DIAGNOSIS — M25571 Pain in right ankle and joints of right foot: Secondary | ICD-10-CM

## 2020-05-20 DIAGNOSIS — T148XXA Other injury of unspecified body region, initial encounter: Secondary | ICD-10-CM

## 2020-05-20 DIAGNOSIS — M79604 Pain in right leg: Secondary | ICD-10-CM

## 2020-05-20 DIAGNOSIS — Z823 Family history of stroke: Secondary | ICD-10-CM

## 2020-05-20 DIAGNOSIS — L230 Allergic contact dermatitis due to metals: Secondary | ICD-10-CM

## 2020-05-20 DIAGNOSIS — Z0000 Encounter for general adult medical examination without abnormal findings: Secondary | ICD-10-CM

## 2020-05-20 DIAGNOSIS — Z8249 Family history of ischemic heart disease and other diseases of the circulatory system: Secondary | ICD-10-CM

## 2020-05-20 DIAGNOSIS — R079 Chest pain, unspecified: Secondary | ICD-10-CM

## 2020-05-20 DIAGNOSIS — Z6839 Body mass index (BMI) 39.0-39.9, adult: Secondary | ICD-10-CM

## 2020-05-20 DIAGNOSIS — R10816 Epigastric abdominal tenderness: Secondary | ICD-10-CM

## 2020-05-20 MED ORDER — VITAMIN B 12 500 MCG PO TABS
ORAL_TABLET
Start: 2020-05-20 — End: ?

## 2020-05-20 MED ORDER — HYDROCODONE-ACETAMINOPHEN 7.5-325 MG PO TABS
ORAL_TABLET
Start: 2020-05-20 — End: ?

## 2020-05-20 MED ORDER — TIZANIDINE HCL 2 MG PO TABS: Start: 2020-05-20 — End: 2020-05-20

## 2020-05-20 MED ORDER — TESTOSTERONE CYPIONATE 200 MG/ML IM SOLN
200 mg | INTRAMUSCULAR | 1 refills | Status: CP
Start: 2020-05-20 — End: ?

## 2020-05-20 MED ORDER — SYRINGE/NEEDLE (DISP) 20G X 1" 1 ML MISC
0 refills | Status: CP
Start: 2020-05-20 — End: ?

## 2020-05-20 MED ORDER — CLONIDINE HCL 0.1 MG PO TABS
0.1 mg | Freq: Once | ORAL | Status: CP
Start: 2020-05-20 — End: ?

## 2020-05-20 MED ORDER — NEEDLE (DISP) 18G X 1-1/2" MISC
0 refills | Status: CP
Start: 2020-05-20 — End: ?

## 2020-05-22 ENCOUNTER — Ambulatory Visit: Payer: Medicare Other

## 2020-05-22 DIAGNOSIS — K219 Gastro-esophageal reflux disease without esophagitis: Secondary | ICD-10-CM

## 2020-05-22 DIAGNOSIS — I10 Essential (primary) hypertension: Principal | ICD-10-CM

## 2020-05-22 DIAGNOSIS — E119 Type 2 diabetes mellitus without complications: Secondary | ICD-10-CM

## 2020-05-22 MED ORDER — ONDANSETRON HCL 4 MG/2ML IJ SOLN
4 mg | INTRAVENOUS | Status: DC | PRN
Start: 2020-05-22 — End: 2020-05-22

## 2020-05-22 MED ORDER — LACTATED RINGERS IV SOLN
INTRAVENOUS | Status: DC | PRN
Start: 2020-05-22 — End: 2020-05-22

## 2020-05-22 MED ORDER — PROMETHAZINE HCL 25 MG/ML IJ SOLN
6.25 mg | INTRAVENOUS | Status: DC | PRN
Start: 2020-05-22 — End: 2020-05-22

## 2020-05-22 MED ORDER — LACTATED RINGERS IV SOLN
Freq: Once | INTRAVENOUS | Status: DC
Start: 2020-05-22 — End: 2020-05-22

## 2020-05-22 MED ORDER — DIPHENHYDRAMINE HCL 50 MG/ML IJ SOLN
12.5 mg | INTRAVENOUS | Status: DC | PRN
Start: 2020-05-22 — End: 2020-05-22

## 2020-05-22 MED ORDER — PROPOFOL 10 MG/ML IV EMUL CUSTOM SH
INTRAVENOUS | Status: DC | PRN
Start: 2020-05-22 — End: 2020-05-22

## 2020-05-22 MED ORDER — LIDOCAINE HCL (PF) 1 % IJ SOLN SH
INTRAVENOUS | Status: DC | PRN
Start: 2020-05-22 — End: 2020-05-22

## 2020-05-23 DIAGNOSIS — E119 Type 2 diabetes mellitus without complications: Secondary | ICD-10-CM

## 2020-05-23 DIAGNOSIS — I10 Essential (primary) hypertension: Principal | ICD-10-CM

## 2020-05-23 DIAGNOSIS — K219 Gastro-esophageal reflux disease without esophagitis: Secondary | ICD-10-CM

## 2020-07-03 ENCOUNTER — Inpatient Hospital Stay: Payer: Medicare Other | Attending: Physician Assistant | Primary: Family

## 2020-07-04 ENCOUNTER — Encounter: Payer: Medicare Other | Primary: Family

## 2020-07-04 ENCOUNTER — Ambulatory Visit: Payer: Medicare Other | Attending: Family | Primary: Family

## 2020-07-04 DIAGNOSIS — U071 COVID-19: Secondary | ICD-10-CM

## 2020-07-04 DIAGNOSIS — R053 Post-COVID chronic cough: Secondary | ICD-10-CM

## 2020-07-04 DIAGNOSIS — K219 Gastro-esophageal reflux disease without esophagitis: Secondary | ICD-10-CM

## 2020-07-04 DIAGNOSIS — U099 Post-COVID chronic cough: Secondary | ICD-10-CM

## 2020-07-04 DIAGNOSIS — E119 Type 2 diabetes mellitus without complications: Secondary | ICD-10-CM

## 2020-07-04 DIAGNOSIS — Q385 Congenital malformations of palate, not elsewhere classified: Secondary | ICD-10-CM

## 2020-07-04 DIAGNOSIS — R0600 Dyspnea, unspecified: Secondary | ICD-10-CM

## 2020-07-04 DIAGNOSIS — I10 Essential (primary) hypertension: Principal | ICD-10-CM

## 2020-07-04 DIAGNOSIS — Z6841 Body Mass Index (BMI) 40.0 and over, adult: Principal | ICD-10-CM

## 2020-07-04 DIAGNOSIS — Z0289 Encounter for other administrative examinations: Secondary | ICD-10-CM

## 2020-07-04 MED ORDER — VITAMIN D3 25 MCG (1000 UT) PO CAPS: Start: 2020-07-04 — End: ?

## 2020-07-04 MED ORDER — TIZANIDINE HCL 2 MG PO TABS: Start: 2020-07-04 — End: ?

## 2020-07-04 MED ORDER — FLUTICASONE-SALMETEROL 500-50 MCG/DOSE IN AEPB
1 | Freq: Two times a day (BID) | RESPIRATORY_TRACT | 2 refills | 30.00 days | Status: CP
Start: 2020-07-04 — End: ?

## 2020-07-04 MED ORDER — PROAIR HFA 108 (90 BASE) MCG/ACT IN AERS
2 | Freq: Four times a day (QID) | RESPIRATORY_TRACT | 2 refills | 25.00000 days | Status: CP | PRN
Start: 2020-07-04 — End: ?

## 2020-07-30 DIAGNOSIS — L2481 Irritant contact dermatitis due to metals: Principal | ICD-10-CM

## 2020-07-30 MED ORDER — TRIAMCINOLONE ACETONIDE 0.1 % EX OINT
1 | Freq: Two times a day (BID) | TOPICAL | 2 refills | 14.00000 days | Status: CP
Start: 2020-07-30 — End: ?

## 2020-08-07 ENCOUNTER — Encounter: Payer: Medicare Other | Attending: Family | Primary: Family

## 2020-09-12 DIAGNOSIS — R0600 Dyspnea, unspecified: Secondary | ICD-10-CM

## 2020-09-12 DIAGNOSIS — U071 COVID-19: Principal | ICD-10-CM

## 2020-09-12 MED ORDER — VENTOLIN HFA 108 (90 BASE) MCG/ACT IN AERS
RESPIRATORY_TRACT | 2 refills | 25.00000 days | Status: CP
Start: 2020-09-12 — End: ?

## 2020-09-26 MED ORDER — DULOXETINE HCL 30 MG PO CPEP
ORAL | 1 refills | 30.00000 days | Status: CP
Start: 2020-09-26 — End: ?

## 2020-09-30 MED ORDER — VITAMIN D (ERGOCALCIFEROL) 1.25 MG (50000 UT) PO CAPS
ORAL | 11 refills | 28.00000 days | Status: CP
Start: 2020-09-30 — End: ?

## 2020-10-22 ENCOUNTER — Ambulatory Visit: Primary: Family

## 2020-10-22 ENCOUNTER — Encounter: Payer: Medicare Other | Primary: Family

## 2020-10-22 DIAGNOSIS — R10816 Epigastric abdominal tenderness: Secondary | ICD-10-CM

## 2020-10-22 DIAGNOSIS — Z6839 Body mass index (BMI) 39.0-39.9, adult: Secondary | ICD-10-CM

## 2020-10-22 DIAGNOSIS — Z0000 Encounter for general adult medical examination without abnormal findings: Secondary | ICD-10-CM

## 2020-10-22 DIAGNOSIS — K1379 Other lesions of oral mucosa: Principal | ICD-10-CM

## 2020-10-22 DIAGNOSIS — E119 Type 2 diabetes mellitus without complications: Secondary | ICD-10-CM

## 2020-10-22 DIAGNOSIS — E559 Vitamin D deficiency, unspecified: Secondary | ICD-10-CM

## 2020-10-22 DIAGNOSIS — F5102 Adjustment insomnia: Secondary | ICD-10-CM

## 2020-10-22 DIAGNOSIS — U071 COVID-19: Secondary | ICD-10-CM

## 2020-10-22 DIAGNOSIS — R1310 Dysphagia, unspecified: Secondary | ICD-10-CM

## 2020-10-22 DIAGNOSIS — Z823 Family history of stroke: Secondary | ICD-10-CM

## 2020-10-22 DIAGNOSIS — M9689 Other intraoperative and postprocedural complications and disorders of the musculoskeletal system: Secondary | ICD-10-CM

## 2020-10-22 DIAGNOSIS — Z1159 Encounter for screening for other viral diseases: Secondary | ICD-10-CM

## 2020-10-22 DIAGNOSIS — Z8249 Family history of ischemic heart disease and other diseases of the circulatory system: Secondary | ICD-10-CM

## 2020-10-22 DIAGNOSIS — Z8371 Family history of colonic polyps: Secondary | ICD-10-CM

## 2020-10-22 DIAGNOSIS — E538 Deficiency of other specified B group vitamins: Secondary | ICD-10-CM

## 2020-10-22 DIAGNOSIS — Q385 Congenital malformations of palate, not elsewhere classified: Secondary | ICD-10-CM

## 2020-10-22 DIAGNOSIS — U099 Post-COVID chronic cough: Secondary | ICD-10-CM

## 2020-10-22 DIAGNOSIS — M2550 Pain in unspecified joint: Secondary | ICD-10-CM

## 2020-10-22 DIAGNOSIS — R7301 Impaired fasting glucose: Secondary | ICD-10-CM

## 2020-10-22 DIAGNOSIS — M79604 Pain in right leg: Secondary | ICD-10-CM

## 2020-10-22 DIAGNOSIS — R7989 Other specified abnormal findings of blood chemistry: Secondary | ICD-10-CM

## 2020-10-22 DIAGNOSIS — G4733 Obstructive sleep apnea (adult) (pediatric): Secondary | ICD-10-CM

## 2020-10-22 DIAGNOSIS — Z6841 Body Mass Index (BMI) 40.0 and over, adult: Secondary | ICD-10-CM

## 2020-10-22 DIAGNOSIS — E291 Testicular hypofunction: Secondary | ICD-10-CM

## 2020-10-22 DIAGNOSIS — K219 Gastro-esophageal reflux disease without esophagitis: Secondary | ICD-10-CM

## 2020-10-22 DIAGNOSIS — Z760 Encounter for issue of repeat prescription: Secondary | ICD-10-CM

## 2020-10-22 DIAGNOSIS — M25562 Pain in left knee: Secondary | ICD-10-CM

## 2020-10-22 DIAGNOSIS — G8929 Other chronic pain: Secondary | ICD-10-CM

## 2020-10-22 DIAGNOSIS — Z833 Family history of diabetes mellitus: Secondary | ICD-10-CM

## 2020-10-22 DIAGNOSIS — Z0289 Encounter for other administrative examinations: Secondary | ICD-10-CM

## 2020-10-22 DIAGNOSIS — N5201 Erectile dysfunction due to arterial insufficiency: Secondary | ICD-10-CM

## 2020-10-22 DIAGNOSIS — I10 Essential (primary) hypertension: Secondary | ICD-10-CM

## 2020-10-22 DIAGNOSIS — R0600 Dyspnea, unspecified: Secondary | ICD-10-CM

## 2020-10-22 DIAGNOSIS — M25571 Pain in right ankle and joints of right foot: Secondary | ICD-10-CM

## 2020-10-22 DIAGNOSIS — T148XXA Other injury of unspecified body region, initial encounter: Secondary | ICD-10-CM

## 2020-10-22 DIAGNOSIS — R053 Post-COVID chronic cough: Secondary | ICD-10-CM

## 2020-10-22 DIAGNOSIS — M25561 Pain in right knee: Secondary | ICD-10-CM

## 2020-10-22 DIAGNOSIS — R109 Unspecified abdominal pain: Secondary | ICD-10-CM

## 2020-10-22 DIAGNOSIS — Z9889 Other specified postprocedural states: Secondary | ICD-10-CM

## 2020-10-29 ENCOUNTER — Ambulatory Visit: Payer: Medicare Other | Attending: Family Medicine | Primary: Family

## 2020-10-29 DIAGNOSIS — F4310 Post-traumatic stress disorder, unspecified: Secondary | ICD-10-CM

## 2020-10-29 DIAGNOSIS — Z6841 Body Mass Index (BMI) 40.0 and over, adult: Secondary | ICD-10-CM

## 2020-10-29 DIAGNOSIS — F32A Depression: Secondary | ICD-10-CM

## 2020-10-29 DIAGNOSIS — I10 Essential (primary) hypertension: Principal | ICD-10-CM

## 2020-10-29 DIAGNOSIS — K219 Gastro-esophageal reflux disease without esophagitis: Secondary | ICD-10-CM

## 2020-10-29 DIAGNOSIS — Z0000 Encounter for general adult medical examination without abnormal findings: Secondary | ICD-10-CM

## 2020-10-29 DIAGNOSIS — Z8659 Personal history of other mental and behavioral disorders: Secondary | ICD-10-CM

## 2020-10-29 DIAGNOSIS — E119 Type 2 diabetes mellitus without complications: Secondary | ICD-10-CM

## 2020-11-05 DIAGNOSIS — F4310 Post-traumatic stress disorder, unspecified: Secondary | ICD-10-CM

## 2020-11-05 DIAGNOSIS — E119 Type 2 diabetes mellitus without complications: Secondary | ICD-10-CM

## 2020-11-05 DIAGNOSIS — K219 Gastro-esophageal reflux disease without esophagitis: Secondary | ICD-10-CM

## 2020-11-05 DIAGNOSIS — I10 Essential (primary) hypertension: Principal | ICD-10-CM

## 2020-11-05 DIAGNOSIS — F32A Depression: Secondary | ICD-10-CM

## 2020-11-21 ENCOUNTER — Telehealth: Payer: Medicare Other | Attending: Family | Primary: Family

## 2020-11-21 DIAGNOSIS — E559 Vitamin D deficiency, unspecified: Principal | ICD-10-CM

## 2020-11-21 DIAGNOSIS — E119 Type 2 diabetes mellitus without complications: Secondary | ICD-10-CM

## 2020-11-21 DIAGNOSIS — Z760 Encounter for issue of repeat prescription: Secondary | ICD-10-CM

## 2020-11-21 DIAGNOSIS — Z712 Person consulting for explanation of examination or test findings: Secondary | ICD-10-CM

## 2020-11-21 DIAGNOSIS — Z6841 Body Mass Index (BMI) 40.0 and over, adult: Secondary | ICD-10-CM

## 2020-11-21 DIAGNOSIS — I10 Essential (primary) hypertension: Secondary | ICD-10-CM

## 2020-11-21 MED ORDER — METFORMIN HCL 500 MG PO TABS
500 mg | Freq: Two times a day (BID) | ORAL | 1 refills | Status: CP
Start: 2020-11-21 — End: ?

## 2020-11-21 MED ORDER — ERGOCALCIFEROL 1.25 MG (50000 UT) PO CAPS
ORAL | 11 refills | 28.00000 days | Status: CP
Start: 2020-11-21 — End: ?

## 2020-11-21 MED ORDER — LOSARTAN POTASSIUM 50 MG PO TABS
50 mg | Freq: Two times a day (BID) | ORAL | 1 refills | Status: CP
Start: 2020-11-21 — End: ?

## 2020-11-25 DIAGNOSIS — U071 COVID-19: Principal | ICD-10-CM

## 2020-11-25 DIAGNOSIS — R0600 Dyspnea, unspecified: Secondary | ICD-10-CM

## 2020-11-25 MED ORDER — VENTOLIN HFA 108 (90 BASE) MCG/ACT IN AERS
RESPIRATORY_TRACT | 2 refills | 25.00000 days | Status: CP
Start: 2020-11-25 — End: ?

## 2020-11-26 DIAGNOSIS — R7989 Other specified abnormal findings of blood chemistry: Principal | ICD-10-CM

## 2020-11-26 MED ORDER — TESTOSTERONE CYPIONATE 200 MG/ML IM SOLN
200 mg | INTRAMUSCULAR | 1 refills | Status: CP
Start: 2020-11-26 — End: ?

## 2020-12-03 ENCOUNTER — Ambulatory Visit: Payer: Medicare Other | Attending: Family | Primary: Family

## 2020-12-03 DIAGNOSIS — I10 Essential (primary) hypertension: Principal | ICD-10-CM

## 2020-12-03 DIAGNOSIS — E119 Type 2 diabetes mellitus without complications: Secondary | ICD-10-CM

## 2020-12-03 DIAGNOSIS — G8921 Chronic pain due to trauma: Secondary | ICD-10-CM

## 2020-12-03 DIAGNOSIS — Z6841 Body Mass Index (BMI) 40.0 and over, adult: Principal | ICD-10-CM

## 2020-12-03 DIAGNOSIS — Z712 Person consulting for explanation of examination or test findings: Secondary | ICD-10-CM

## 2020-12-03 DIAGNOSIS — Z79899 Other long term (current) drug therapy: Secondary | ICD-10-CM

## 2020-12-03 DIAGNOSIS — K219 Gastro-esophageal reflux disease without esophagitis: Secondary | ICD-10-CM

## 2020-12-03 DIAGNOSIS — F4310 Post-traumatic stress disorder, unspecified: Secondary | ICD-10-CM

## 2020-12-03 DIAGNOSIS — F32A Depression: Secondary | ICD-10-CM

## 2020-12-03 DIAGNOSIS — R7989 Other specified abnormal findings of blood chemistry: Secondary | ICD-10-CM

## 2020-12-03 MED ORDER — TESTOSTERONE CYPIONATE 200 MG/ML IM SOLN
200 mg | INTRAMUSCULAR | 1 refills | Status: CP
Start: 2020-12-03 — End: ?

## 2021-01-23 MED ORDER — IBUPROFEN 800 MG PO TABS
ORAL | 0 refills | 25.00000 days | Status: CP
Start: 2021-01-23 — End: ?

## 2021-01-28 DIAGNOSIS — L2481 Irritant contact dermatitis due to metals: Principal | ICD-10-CM

## 2021-01-28 DIAGNOSIS — R7989 Other specified abnormal findings of blood chemistry: Secondary | ICD-10-CM

## 2021-01-28 DIAGNOSIS — I10 Essential (primary) hypertension: Secondary | ICD-10-CM

## 2021-01-28 DIAGNOSIS — R0600 Dyspnea, unspecified: Secondary | ICD-10-CM

## 2021-01-28 DIAGNOSIS — U071 COVID-19: Secondary | ICD-10-CM

## 2021-01-28 DIAGNOSIS — Z760 Encounter for issue of repeat prescription: Secondary | ICD-10-CM

## 2021-01-28 DIAGNOSIS — E119 Type 2 diabetes mellitus without complications: Secondary | ICD-10-CM

## 2021-01-28 DIAGNOSIS — Z79899 Other long term (current) drug therapy: Secondary | ICD-10-CM

## 2021-01-30 MED ORDER — VENTOLIN HFA 108 (90 BASE) MCG/ACT IN AERS
2 | Freq: Four times a day (QID) | RESPIRATORY_TRACT | 2 refills | 25.00000 days | Status: CP | PRN
Start: 2021-01-30 — End: ?

## 2021-01-30 MED ORDER — TESTOSTERONE CYPIONATE 200 MG/ML IM SOLN
200 mg | INTRAMUSCULAR | 1 refills
Start: 2021-01-30 — End: ?

## 2021-01-30 MED ORDER — IBUPROFEN 800 MG PO TABS
800 mg | Freq: Three times a day (TID) | ORAL | 0 refills | 25.00000 days | Status: CP | PRN
Start: 2021-01-30 — End: ?

## 2021-01-30 MED ORDER — TRIAMCINOLONE ACETONIDE 0.1 % EX OINT
1 | Freq: Two times a day (BID) | TOPICAL | 2 refills | 14.00000 days | Status: CP
Start: 2021-01-30 — End: ?

## 2021-01-30 MED ORDER — LOSARTAN POTASSIUM 50 MG PO TABS
50 mg | Freq: Two times a day (BID) | ORAL | 1 refills | Status: CP
Start: 2021-01-30 — End: ?

## 2021-01-30 MED ORDER — METFORMIN HCL 500 MG PO TABS
500 mg | Freq: Two times a day (BID) | ORAL | 1 refills | Status: CP
Start: 2021-01-30 — End: ?

## 2021-03-25 MED ORDER — DULOXETINE HCL 30 MG PO CPEP
1 refills | Status: CP
Start: 2021-03-25 — End: ?

## 2021-05-21 DIAGNOSIS — Z79899 Other long term (current) drug therapy: Secondary | ICD-10-CM

## 2021-05-21 DIAGNOSIS — R7989 Other specified abnormal findings of blood chemistry: Principal | ICD-10-CM

## 2021-05-22 MED ORDER — TESTOSTERONE CYPIONATE 200 MG/ML IM SOLN
1 refills
Start: 2021-05-22 — End: ?

## 2021-06-15 DIAGNOSIS — L2481 Irritant contact dermatitis due to metals: Principal | ICD-10-CM

## 2021-06-18 MED ORDER — TRIAMCINOLONE ACETONIDE 0.1 % EX OINT
1 | Freq: Two times a day (BID) | TOPICAL | 2 refills | 14.00000 days | Status: CP
Start: 2021-06-18 — End: ?

## 2021-06-23 DIAGNOSIS — R7989 Other specified abnormal findings of blood chemistry: Principal | ICD-10-CM

## 2021-06-23 DIAGNOSIS — Z79899 Other long term (current) drug therapy: Secondary | ICD-10-CM

## 2021-06-23 MED ORDER — TESTOSTERONE CYPIONATE 200 MG/ML IM SOLN: Start: 2021-06-23 — End: ?

## 2021-08-16 DIAGNOSIS — L2481 Irritant contact dermatitis due to metals: Principal | ICD-10-CM

## 2021-08-18 MED ORDER — TRIAMCINOLONE ACETONIDE 0.1 % EX OINT
1 | Freq: Two times a day (BID) | TOPICAL | 2 refills | Status: CP
Start: 2021-08-18 — End: ?

## 2021-09-04 DIAGNOSIS — I10 Essential (primary) hypertension: Principal | ICD-10-CM

## 2021-09-04 DIAGNOSIS — Z760 Encounter for issue of repeat prescription: Secondary | ICD-10-CM

## 2021-09-04 MED ORDER — LOSARTAN POTASSIUM 50 MG PO TABS
50 mg | Freq: Two times a day (BID) | ORAL | 1 refills | 30.00 days | Status: CP
Start: 2021-09-04 — End: ?

## 2021-09-05 ENCOUNTER — Ambulatory Visit: Primary: Family

## 2021-09-05 ENCOUNTER — Ambulatory Visit: Payer: Medicare Other | Attending: Family | Primary: Family

## 2021-09-05 DIAGNOSIS — E119 Type 2 diabetes mellitus without complications: Secondary | ICD-10-CM

## 2021-09-05 DIAGNOSIS — Z6841 Body Mass Index (BMI) 40.0 and over, adult: Secondary | ICD-10-CM

## 2021-09-05 DIAGNOSIS — G8929 Other chronic pain: Secondary | ICD-10-CM

## 2021-09-05 DIAGNOSIS — R7989 Other specified abnormal findings of blood chemistry: Principal | ICD-10-CM

## 2021-09-05 DIAGNOSIS — E291 Testicular hypofunction: Secondary | ICD-10-CM

## 2021-09-05 DIAGNOSIS — I10 Essential (primary) hypertension: Secondary | ICD-10-CM

## 2021-09-05 DIAGNOSIS — F4310 Post-traumatic stress disorder, unspecified: Secondary | ICD-10-CM

## 2021-09-05 DIAGNOSIS — E559 Vitamin D deficiency, unspecified: Secondary | ICD-10-CM

## 2021-09-05 DIAGNOSIS — K219 Gastro-esophageal reflux disease without esophagitis: Secondary | ICD-10-CM

## 2021-09-05 DIAGNOSIS — Z760 Encounter for issue of repeat prescription: Secondary | ICD-10-CM

## 2021-09-05 DIAGNOSIS — M25562 Pain in left knee: Secondary | ICD-10-CM

## 2021-09-05 DIAGNOSIS — Z79899 Other long term (current) drug therapy: Secondary | ICD-10-CM

## 2021-09-05 DIAGNOSIS — E538 Deficiency of other specified B group vitamins: Secondary | ICD-10-CM

## 2021-09-05 DIAGNOSIS — Z2821 Immunization not carried out because of patient refusal: Secondary | ICD-10-CM

## 2021-09-05 DIAGNOSIS — L2481 Irritant contact dermatitis due to metals: Secondary | ICD-10-CM

## 2021-09-05 DIAGNOSIS — F32A Depression: Secondary | ICD-10-CM

## 2021-09-05 DIAGNOSIS — M25561 Pain in right knee: Secondary | ICD-10-CM

## 2021-09-05 DIAGNOSIS — Z1329 Encounter for screening for other suspected endocrine disorder: Secondary | ICD-10-CM

## 2021-09-05 MED ORDER — METFORMIN HCL 500 MG PO TABS
500 mg | Freq: Two times a day (BID) | ORAL | 1 refills | 90.00 days | Status: CP
Start: 2021-09-05 — End: ?

## 2021-09-05 MED ORDER — ERGOCALCIFEROL 1.25 MG (50000 UT) PO CAPS
ORAL | 11 refills | 28.00000 days | Status: CP
Start: 2021-09-05 — End: ?

## 2021-09-05 MED ORDER — TESTOSTERONE CYPIONATE 200 MG/ML IM SOLN
200 mg | INTRAMUSCULAR | 1 refills | Status: CP
Start: 2021-09-05 — End: ?

## 2021-09-05 MED ORDER — SYRINGE/NEEDLE (DISP) 20G X 1" 1 ML MISC
0 refills | Status: CP
Start: 2021-09-05 — End: ?

## 2021-09-05 MED ORDER — TRIAMCINOLONE ACETONIDE 0.1 % EX OINT
TOPICAL | 3 refills | 14.00000 days | Status: CP
Start: 2021-09-05 — End: ?

## 2021-09-05 MED ORDER — NEEDLE (DISP) 21G X 1-1/2" MISC
0 refills | Status: CP
Start: 2021-09-05 — End: ?

## 2021-09-16 DIAGNOSIS — M25561 Pain in right knee: Principal | ICD-10-CM

## 2021-09-16 DIAGNOSIS — M25562 Pain in left knee: Secondary | ICD-10-CM

## 2021-09-17 ENCOUNTER — Encounter: Payer: Medicare Other | Attending: Orthopaedic Surgery | Primary: Family

## 2021-09-29 DIAGNOSIS — F99 Mental disorder, not otherwise specified: Principal | ICD-10-CM

## 2021-09-30 ENCOUNTER — Ambulatory Visit: Payer: Medicare Other | Attending: Surgical | Primary: Family

## 2021-09-30 DIAGNOSIS — M25561 Pain in right knee: Secondary | ICD-10-CM

## 2021-09-30 DIAGNOSIS — M25562 Pain in left knee: Secondary | ICD-10-CM

## 2021-09-30 DIAGNOSIS — S86812S Strain of other muscle(s) and tendon(s) at lower leg level, left leg, sequela: Principal | ICD-10-CM

## 2021-09-30 DIAGNOSIS — G8929 Other chronic pain: Secondary | ICD-10-CM

## 2021-09-30 DIAGNOSIS — M170 Bilateral primary osteoarthritis of knee: Secondary | ICD-10-CM

## 2021-09-30 DIAGNOSIS — S86811S Strain of other muscle(s) and tendon(s) at lower leg level, right leg, sequela: Secondary | ICD-10-CM

## 2021-10-01 DIAGNOSIS — M170 Bilateral primary osteoarthritis of knee: Principal | ICD-10-CM

## 2021-10-01 MED ORDER — DUROLANE 60 MG/3ML IX PRSY
INTRA_ARTICULAR | 0 refills | 1.00000 days | Status: CP
Start: 2021-10-01 — End: ?

## 2021-10-02 DIAGNOSIS — M170 Bilateral primary osteoarthritis of knee: Principal | ICD-10-CM

## 2021-10-07 DIAGNOSIS — M170 Bilateral primary osteoarthritis of knee: Principal | ICD-10-CM

## 2021-10-07 MED ORDER — SODIUM HYALURONATE 60 MG/3ML IX PRSY
60 mg | Freq: Once | INTRA_ARTICULAR | 0 refills | Status: CP
Start: 2021-10-07 — End: ?

## 2021-10-07 MED ORDER — DUROLANE 60 MG/3ML IX PRSY
0 refills | Status: CP
Start: 2021-10-07 — End: ?

## 2021-11-06 MED ORDER — DUROLANE 60 MG/3ML IX PRSY
0 refills
Start: 2021-11-06 — End: ?

## 2021-11-26 DIAGNOSIS — F99 Mental disorder, not otherwise specified: Principal | ICD-10-CM

## 2021-11-28 DIAGNOSIS — M25561 Pain in right knee: Principal | ICD-10-CM

## 2021-11-28 DIAGNOSIS — M25562 Pain in left knee: Secondary | ICD-10-CM

## 2021-12-02 ENCOUNTER — Ambulatory Visit: Primary: Family

## 2021-12-02 ENCOUNTER — Ambulatory Visit: Payer: Medicare Other | Attending: Family | Primary: Family

## 2021-12-02 ENCOUNTER — Encounter: Payer: Medicare Other | Attending: Surgical | Primary: Family

## 2021-12-02 DIAGNOSIS — E119 Type 2 diabetes mellitus without complications: Secondary | ICD-10-CM

## 2021-12-02 DIAGNOSIS — E538 Deficiency of other specified B group vitamins: Secondary | ICD-10-CM

## 2021-12-02 DIAGNOSIS — R053 Post-COVID chronic cough: Secondary | ICD-10-CM

## 2021-12-02 DIAGNOSIS — L2481 Irritant contact dermatitis due to metals: Secondary | ICD-10-CM

## 2021-12-02 DIAGNOSIS — Z6841 Body Mass Index (BMI) 40.0 and over, adult: Secondary | ICD-10-CM

## 2021-12-02 DIAGNOSIS — R7989 Other specified abnormal findings of blood chemistry: Secondary | ICD-10-CM

## 2021-12-02 DIAGNOSIS — K219 Gastro-esophageal reflux disease without esophagitis: Secondary | ICD-10-CM

## 2021-12-02 DIAGNOSIS — I10 Essential (primary) hypertension: Secondary | ICD-10-CM

## 2021-12-02 DIAGNOSIS — R0609 Other forms of dyspnea: Secondary | ICD-10-CM

## 2021-12-02 DIAGNOSIS — U071 COVID-19: Secondary | ICD-10-CM

## 2021-12-02 DIAGNOSIS — E559 Vitamin D deficiency, unspecified: Secondary | ICD-10-CM

## 2021-12-02 DIAGNOSIS — Z760 Encounter for issue of repeat prescription: Secondary | ICD-10-CM

## 2021-12-02 DIAGNOSIS — R079 Chest pain, unspecified: Secondary | ICD-10-CM

## 2021-12-02 DIAGNOSIS — F4310 Post-traumatic stress disorder, unspecified: Secondary | ICD-10-CM

## 2021-12-02 DIAGNOSIS — Z1329 Encounter for screening for other suspected endocrine disorder: Principal | ICD-10-CM

## 2021-12-02 DIAGNOSIS — E291 Testicular hypofunction: Secondary | ICD-10-CM

## 2021-12-02 DIAGNOSIS — F32A Depression: Secondary | ICD-10-CM

## 2021-12-02 DIAGNOSIS — U099 Post-COVID chronic cough: Secondary | ICD-10-CM

## 2021-12-02 DIAGNOSIS — Z79899 Other long term (current) drug therapy: Secondary | ICD-10-CM

## 2021-12-02 MED ORDER — TRIAMCINOLONE ACETONIDE 0.1 % EX OINT
TOPICAL | 3 refills | 14.00000 days | Status: CP
Start: 2021-12-02 — End: ?

## 2021-12-02 MED ORDER — DIAZEPAM 5 MG PO TABS: Start: 2021-12-02 — End: ?

## 2021-12-02 MED ORDER — VITAMIN B 12 500 MCG PO TABS
1 | ORAL_TABLET | Freq: Every day | ORAL
Start: 2021-12-02 — End: 2021-12-02

## 2021-12-02 MED ORDER — METFORMIN HCL 500 MG PO TABS
Freq: Two times a day (BID)
Start: 2021-12-02 — End: 2021-12-02

## 2021-12-02 MED ORDER — ATORVASTATIN CALCIUM 40 MG PO TABS
40 mg | ORAL
Start: 2021-12-02 — End: ?

## 2021-12-02 MED ORDER — ERGOCALCIFEROL 1.25 MG (50000 UT) PO CAPS
1.25 mg | ORAL | 1 refills | 28.00000 days | Status: CP
Start: 2021-12-02 — End: ?

## 2021-12-02 MED ORDER — BD LUER-LOK SYRINGE 21G X 1-1/2" 3 ML MISC: Start: 2021-12-02 — End: ?

## 2021-12-02 MED ORDER — TESTOSTERONE CYPIONATE 200 MG/ML IM SOLN
200 mg | INTRAMUSCULAR | 1 refills | Status: CP
Start: 2021-12-02 — End: ?

## 2021-12-02 MED ORDER — ERGOCALCIFEROL 1.25 MG (50000 UT) PO CAPS
11 refills | Status: CN
Start: 2021-12-02 — End: ?

## 2021-12-02 MED ORDER — ENOXAPARIN SODIUM 30 MG/0.3ML IJ SOSY: Start: 2021-12-02 — End: ?

## 2021-12-02 MED ORDER — VENTOLIN HFA 108 (90 BASE) MCG/ACT IN AERS
2 | Freq: Four times a day (QID) | RESPIRATORY_TRACT | 2 refills | 25.00000 days | Status: CP | PRN
Start: 2021-12-02 — End: ?

## 2021-12-02 MED ORDER — LOSARTAN POTASSIUM 50 MG PO TABS
50 mg | Freq: Two times a day (BID) | ORAL | 1 refills | Status: CP
Start: 2021-12-02 — End: ?

## 2021-12-02 MED ORDER — NEEDLE (DISP) 21G X 1-1/2" MISC
0 refills | Status: CP
Start: 2021-12-02 — End: ?

## 2021-12-02 MED ORDER — BD LUER-LOK SYRINGE 23G X 1" 3 ML MISC: Start: 2021-12-02 — End: ?

## 2021-12-02 MED ORDER — LOSARTAN POTASSIUM 25 MG PO TABS
Freq: Two times a day (BID)
Start: 2021-12-02 — End: 2021-12-02

## 2021-12-02 MED ORDER — SYRINGE/NEEDLE (DISP) 20G X 1" 1 ML MISC
0 refills | Status: CP
Start: 2021-12-02 — End: ?

## 2021-12-02 MED ORDER — AMLODIPINE BESYLATE 5 MG PO TABS: Start: 2021-12-02 — End: ?

## 2021-12-02 MED ORDER — FLUTICASONE-SALMETEROL 500-50 MCG/ACT IN AEPB
1 | Freq: Two times a day (BID) | RESPIRATORY_TRACT | 2 refills | Status: CP
Start: 2021-12-02 — End: ?

## 2021-12-02 MED ORDER — VITAMIN D3 25 MCG (1000 UT) PO CAPS
Status: CN
Start: 2021-12-02 — End: ?

## 2021-12-02 MED ORDER — NEEDLE (DISP) 18G X 1-1/2" MISC
0 refills | Status: CP
Start: 2021-12-02 — End: ?

## 2021-12-04 ENCOUNTER — Encounter: Payer: Medicare Other | Primary: Family

## 2021-12-04 ENCOUNTER — Ambulatory Visit: Payer: Medicare Other | Attending: Surgical | Primary: Family

## 2021-12-04 DIAGNOSIS — S86812S Strain of other muscle(s) and tendon(s) at lower leg level, left leg, sequela: Secondary | ICD-10-CM

## 2021-12-04 DIAGNOSIS — M25562 Pain in left knee: Principal | ICD-10-CM

## 2021-12-04 DIAGNOSIS — K219 Gastro-esophageal reflux disease without esophagitis: Secondary | ICD-10-CM

## 2021-12-04 DIAGNOSIS — M170 Bilateral primary osteoarthritis of knee: Principal | ICD-10-CM

## 2021-12-04 DIAGNOSIS — E119 Type 2 diabetes mellitus without complications: Secondary | ICD-10-CM

## 2021-12-04 DIAGNOSIS — M25561 Pain in right knee: Principal | ICD-10-CM

## 2021-12-04 DIAGNOSIS — I10 Essential (primary) hypertension: Principal | ICD-10-CM

## 2021-12-04 DIAGNOSIS — F4310 Post-traumatic stress disorder, unspecified: Secondary | ICD-10-CM

## 2021-12-04 DIAGNOSIS — F32A Depression: Secondary | ICD-10-CM

## 2021-12-04 DIAGNOSIS — S86811S Strain of other muscle(s) and tendon(s) at lower leg level, right leg, sequela: Secondary | ICD-10-CM

## 2021-12-08 ENCOUNTER — Encounter: Payer: Medicare Other | Primary: Family

## 2021-12-16 ENCOUNTER — Encounter: Payer: Medicare Other | Primary: Family

## 2022-02-26 ENCOUNTER — Inpatient Hospital Stay: Admit: 2022-02-26 | Discharge: 2022-02-26 | Payer: Medicare Other

## 2022-02-26 ENCOUNTER — Emergency Department: Admit: 2022-02-26 | Discharge: 2022-02-26 | Payer: Medicare Other | Primary: Family

## 2022-02-26 DIAGNOSIS — R0789 Other chest pain: Secondary | ICD-10-CM

## 2022-02-26 DIAGNOSIS — R079 Chest pain, unspecified: Principal | ICD-10-CM

## 2022-02-26 MED ORDER — ALUM & MAG HYDROX-SIMETH 1200-1200-120 MG/30ML PO SUSP UD
30 mL | Freq: Once | ORAL | Status: CP
Start: 2022-02-26 — End: ?

## 2022-02-26 MED ORDER — KETOROLAC TROMETHAMINE 15 MG/ML IJ SOLN
30 mg | Freq: Once | INTRAMUSCULAR | Status: CP
Start: 2022-02-26 — End: ?

## 2022-02-26 MED ORDER — TETANUS-DIPHTHERIA TOXOIDS TD 5-2 LFU IM INJ
.5 mL | Freq: Once | INTRAMUSCULAR | Status: CP
Start: 2022-02-26 — End: ?

## 2022-02-26 MED ORDER — ASPIRIN 325 MG PO TABS
325 mg | Freq: Once | ORAL | Status: CP
Start: 2022-02-26 — End: ?

## 2022-03-16 DIAGNOSIS — R0609 Other forms of dyspnea: Secondary | ICD-10-CM

## 2022-03-16 DIAGNOSIS — U071 COVID-19: Principal | ICD-10-CM

## 2022-03-16 MED ORDER — ADVAIR DISKUS 500-50 MCG/ACT IN AEPB
2 refills | Status: CP
Start: 2022-03-16 — End: ?

## 2022-03-19 ENCOUNTER — Inpatient Hospital Stay: Payer: Medicare Other

## 2022-03-19 DIAGNOSIS — S41119A Laceration without foreign body of unspecified upper arm, initial encounter: Principal | ICD-10-CM

## 2022-03-19 MED ORDER — TRIPLE ANTIBIOTIC 5-400-5000 EX OINT
Freq: Once | TOPICAL | Status: CP
Start: 2022-03-19 — End: ?

## 2022-03-19 MED ORDER — LIDOCAINE-EPINEPHRINE 2 %-1:100000 IJ SOLN
Freq: Once | Status: CP
Start: 2022-03-19 — End: ?

## 2022-03-28 ENCOUNTER — Inpatient Hospital Stay: Payer: Medicare Other

## 2022-03-28 DIAGNOSIS — Z5189 Encounter for other specified aftercare: Principal | ICD-10-CM

## 2022-04-04 ENCOUNTER — Inpatient Hospital Stay: Payer: Medicare Other

## 2022-04-04 DIAGNOSIS — K219 Gastro-esophageal reflux disease without esophagitis: Secondary | ICD-10-CM

## 2022-04-04 DIAGNOSIS — F32A Depression: Secondary | ICD-10-CM

## 2022-04-04 DIAGNOSIS — I10 Essential (primary) hypertension: Principal | ICD-10-CM

## 2022-04-04 DIAGNOSIS — Z4802 Encounter for removal of sutures: Principal | ICD-10-CM

## 2022-04-04 DIAGNOSIS — E119 Type 2 diabetes mellitus without complications: Secondary | ICD-10-CM

## 2022-04-04 DIAGNOSIS — F4310 Post-traumatic stress disorder, unspecified: Secondary | ICD-10-CM

## 2022-04-04 DIAGNOSIS — S41112S Laceration without foreign body of left upper arm, sequela: Secondary | ICD-10-CM

## 2022-04-13 ENCOUNTER — Ambulatory Visit: Payer: Medicare Other | Attending: Family | Primary: Family

## 2022-04-13 DIAGNOSIS — I10 Essential (primary) hypertension: Principal | ICD-10-CM

## 2022-04-13 DIAGNOSIS — M170 Bilateral primary osteoarthritis of knee: Principal | ICD-10-CM

## 2022-04-13 DIAGNOSIS — F32A Depression: Secondary | ICD-10-CM

## 2022-04-13 DIAGNOSIS — M25561 Pain in right knee: Secondary | ICD-10-CM

## 2022-04-13 DIAGNOSIS — G8929 Other chronic pain: Secondary | ICD-10-CM

## 2022-04-13 DIAGNOSIS — M25562 Pain in left knee: Secondary | ICD-10-CM

## 2022-04-13 DIAGNOSIS — S86811S Strain of other muscle(s) and tendon(s) at lower leg level, right leg, sequela: Secondary | ICD-10-CM

## 2022-04-13 DIAGNOSIS — F4310 Post-traumatic stress disorder, unspecified: Secondary | ICD-10-CM

## 2022-04-13 DIAGNOSIS — E119 Type 2 diabetes mellitus without complications: Secondary | ICD-10-CM

## 2022-04-13 DIAGNOSIS — K219 Gastro-esophageal reflux disease without esophagitis: Secondary | ICD-10-CM

## 2022-04-13 DIAGNOSIS — S86812S Strain of other muscle(s) and tendon(s) at lower leg level, left leg, sequela: Secondary | ICD-10-CM

## 2022-04-13 MED ORDER — TRIAMCINOLONE ACETONIDE 40 MG/ML IJ SUSP
40 mg | Freq: Once | INTRA_ARTICULAR | Status: CP | PRN
Start: 2022-04-13 — End: ?

## 2022-04-13 MED ORDER — BUPIVACAINE HCL 0.5 % IJ SOLN
4 mL | Freq: Once | INTRA_ARTICULAR | Status: CP | PRN
Start: 2022-04-13 — End: ?

## 2022-05-04 ENCOUNTER — Encounter: Payer: Medicare Other | Attending: Family | Primary: Family

## 2022-05-17 DIAGNOSIS — Z760 Encounter for issue of repeat prescription: Secondary | ICD-10-CM

## 2022-05-17 DIAGNOSIS — E119 Type 2 diabetes mellitus without complications: Principal | ICD-10-CM

## 2022-05-19 ENCOUNTER — Telehealth: Payer: Medicare Other | Attending: Family | Primary: Family

## 2022-05-19 DIAGNOSIS — F32A Depression: Secondary | ICD-10-CM

## 2022-05-19 DIAGNOSIS — Z760 Encounter for issue of repeat prescription: Secondary | ICD-10-CM

## 2022-05-19 DIAGNOSIS — E559 Vitamin D deficiency, unspecified: Secondary | ICD-10-CM

## 2022-05-19 DIAGNOSIS — R0609 Other forms of dyspnea: Secondary | ICD-10-CM

## 2022-05-19 DIAGNOSIS — F4310 Post-traumatic stress disorder, unspecified: Secondary | ICD-10-CM

## 2022-05-19 DIAGNOSIS — E119 Type 2 diabetes mellitus without complications: Secondary | ICD-10-CM

## 2022-05-19 DIAGNOSIS — I10 Essential (primary) hypertension: Secondary | ICD-10-CM

## 2022-05-19 DIAGNOSIS — R7989 Other specified abnormal findings of blood chemistry: Principal | ICD-10-CM

## 2022-05-19 DIAGNOSIS — K219 Gastro-esophageal reflux disease without esophagitis: Secondary | ICD-10-CM

## 2022-05-19 DIAGNOSIS — Z6841 Body Mass Index (BMI) 40.0 and over, adult: Secondary | ICD-10-CM

## 2022-05-19 DIAGNOSIS — E782 Mixed hyperlipidemia: Secondary | ICD-10-CM

## 2022-05-19 MED ORDER — METFORMIN HCL 500 MG PO TABS
500 mg | Freq: Two times a day (BID) | ORAL | 1 refills | Status: CP
Start: 2022-05-19 — End: ?

## 2022-05-19 MED ORDER — TESTOSTERONE CYPIONATE 200 MG/ML IM SOLN
200 mg | INTRAMUSCULAR | 1 refills | Status: CP
Start: 2022-05-19 — End: ?

## 2022-05-19 MED ORDER — ALBUTEROL SULFATE (5 MG/ML) 0.5% IN NEBU
5 mg | Freq: Four times a day (QID) | RESPIRATORY_TRACT | 2 refills | 25.00000 days | Status: CP | PRN
Start: 2022-05-19 — End: 2022-05-19

## 2022-05-19 MED ORDER — LOSARTAN POTASSIUM 50 MG PO TABS
50 mg | Freq: Two times a day (BID) | ORAL | 1 refills | Status: CP
Start: 2022-05-19 — End: ?

## 2022-05-19 MED ORDER — ERGOCALCIFEROL 1.25 MG (50000 UT) PO CAPS
ORAL | 11 refills | 28.00000 days | Status: CP
Start: 2022-05-19 — End: ?

## 2022-05-19 MED ORDER — ALBUTEROL SULFATE (5 MG/ML) 0.5% IN NEBU
5 mg | Freq: Four times a day (QID) | RESPIRATORY_TRACT | 2 refills | 25.00000 days | Status: CP | PRN
Start: 2022-05-19 — End: ?

## 2022-05-25 ENCOUNTER — Ambulatory Visit: Primary: Family

## 2022-05-25 DIAGNOSIS — R7989 Other specified abnormal findings of blood chemistry: Secondary | ICD-10-CM

## 2022-05-25 DIAGNOSIS — E782 Mixed hyperlipidemia: Secondary | ICD-10-CM

## 2022-05-25 DIAGNOSIS — E119 Type 2 diabetes mellitus without complications: Principal | ICD-10-CM

## 2022-05-25 DIAGNOSIS — I10 Essential (primary) hypertension: Secondary | ICD-10-CM

## 2022-05-29 DIAGNOSIS — U071 COVID-19: Principal | ICD-10-CM

## 2022-05-29 DIAGNOSIS — R0609 Other forms of dyspnea: Secondary | ICD-10-CM

## 2022-06-02 MED ORDER — ALBUTEROL SULFATE HFA 108 (90 BASE) MCG/ACT IN AERS
2 refills
Start: 2022-06-02 — End: ?

## 2022-06-16 DIAGNOSIS — U071 COVID-19: Principal | ICD-10-CM

## 2022-06-16 DIAGNOSIS — R0609 Other forms of dyspnea: Secondary | ICD-10-CM

## 2022-06-16 MED ORDER — ALBUTEROL SULFATE HFA 108 (90 BASE) MCG/ACT IN AERS
RESPIRATORY_TRACT | 2 refills | 25.00000 days | Status: CP
Start: 2022-06-16 — End: ?

## 2022-07-17 ENCOUNTER — Ambulatory Visit: Payer: Medicare Other | Attending: Family | Primary: Family

## 2022-07-17 DIAGNOSIS — E119 Type 2 diabetes mellitus without complications: Secondary | ICD-10-CM

## 2022-07-17 DIAGNOSIS — K219 Gastro-esophageal reflux disease without esophagitis: Secondary | ICD-10-CM

## 2022-07-17 DIAGNOSIS — I10 Essential (primary) hypertension: Principal | ICD-10-CM

## 2022-07-17 DIAGNOSIS — S86812S Strain of other muscle(s) and tendon(s) at lower leg level, left leg, sequela: Secondary | ICD-10-CM

## 2022-07-17 DIAGNOSIS — S86811S Strain of other muscle(s) and tendon(s) at lower leg level, right leg, sequela: Secondary | ICD-10-CM

## 2022-07-17 DIAGNOSIS — F32A Depression: Secondary | ICD-10-CM

## 2022-07-17 DIAGNOSIS — F4310 Post-traumatic stress disorder, unspecified: Secondary | ICD-10-CM

## 2022-07-17 DIAGNOSIS — M170 Bilateral primary osteoarthritis of knee: Principal | ICD-10-CM

## 2022-07-19 MED ORDER — TRIAMCINOLONE ACETONIDE 40 MG/ML IJ SUSP
40 mg | Freq: Once | INTRA_ARTICULAR | Status: CP | PRN
Start: 2022-07-19 — End: ?

## 2022-07-19 MED ORDER — BUPIVACAINE HCL 0.5 % IJ SOLN
4 mL | Freq: Once | INTRA_ARTICULAR | Status: CP | PRN
Start: 2022-07-19 — End: ?

## 2022-07-20 DIAGNOSIS — L2481 Irritant contact dermatitis due to metals: Principal | ICD-10-CM

## 2022-07-27 MED ORDER — TRIAMCINOLONE ACETONIDE 0.1 % EX OINT
TOPICAL | 3 refills | 14.00000 days | Status: CP
Start: 2022-07-27 — End: ?

## 2022-09-03 ENCOUNTER — Ambulatory Visit: Payer: Medicare Other | Attending: Family | Primary: Family

## 2022-09-03 DIAGNOSIS — I10 Essential (primary) hypertension: Principal | ICD-10-CM

## 2022-09-03 DIAGNOSIS — E119 Type 2 diabetes mellitus without complications: Secondary | ICD-10-CM

## 2022-09-03 DIAGNOSIS — E559 Vitamin D deficiency, unspecified: Secondary | ICD-10-CM

## 2022-09-03 DIAGNOSIS — F32A Depression: Secondary | ICD-10-CM

## 2022-09-03 DIAGNOSIS — U099 Post-COVID chronic cough: Secondary | ICD-10-CM

## 2022-09-03 DIAGNOSIS — R7989 Other specified abnormal findings of blood chemistry: Secondary | ICD-10-CM

## 2022-09-03 DIAGNOSIS — Z6841 Body Mass Index (BMI) 40.0 and over, adult: Principal | ICD-10-CM

## 2022-09-03 DIAGNOSIS — R053 Post-COVID chronic cough: Secondary | ICD-10-CM

## 2022-09-03 DIAGNOSIS — R0609 Other forms of dyspnea: Secondary | ICD-10-CM

## 2022-09-03 DIAGNOSIS — F4310 Post-traumatic stress disorder, unspecified: Secondary | ICD-10-CM

## 2022-09-03 DIAGNOSIS — K219 Gastro-esophageal reflux disease without esophagitis: Secondary | ICD-10-CM

## 2022-09-03 DIAGNOSIS — Z760 Encounter for issue of repeat prescription: Secondary | ICD-10-CM

## 2022-09-03 MED ORDER — FLUOXETINE HCL 10 MG PO CAPS
10 mg | Freq: Every morning | ORAL
Start: 2022-09-03 — End: ?

## 2022-09-03 MED ORDER — NEEDLE (DISP) 18G X 1-1/2" MISC
0 refills | Status: CP
Start: 2022-09-03 — End: ?

## 2022-09-03 MED ORDER — ERGOCALCIFEROL 1.25 MG (50000 UT) PO CAPS
ORAL | 11 refills | 28.00000 days | Status: CP
Start: 2022-09-03 — End: ?

## 2022-09-03 MED ORDER — BD LUER-LOK SYRINGE 23G X 1" 3 ML MISC
2 refills | Status: CP
Start: 2022-09-03 — End: ?

## 2022-09-03 MED ORDER — PRAZOSIN HCL 1 MG PO CAPS
1 mg | Freq: Every evening | ORAL
Start: 2022-09-03 — End: ?

## 2022-09-03 MED ORDER — ALBUTEROL SULFATE HFA 108 (90 BASE) MCG/ACT IN AERS
2 refills | Status: CP
Start: 2022-09-03 — End: ?

## 2022-09-03 MED ORDER — LOSARTAN POTASSIUM 50 MG PO TABS
50 mg | Freq: Two times a day (BID) | ORAL | 1 refills | Status: CP
Start: 2022-09-03 — End: ?

## 2022-09-03 MED ORDER — FLUTICASONE-SALMETEROL 500-50 MCG/ACT IN AEPB
2 refills | Status: CP
Start: 2022-09-03 — End: ?

## 2022-09-03 MED ORDER — METFORMIN HCL 500 MG PO TABS
500 mg | Freq: Two times a day (BID) | ORAL | 1 refills | Status: CP
Start: 2022-09-03 — End: ?

## 2022-09-03 MED ORDER — HYDROCODONE-ACETAMINOPHEN 10-325 MG PO TABS
ORAL_TABLET
Start: 2022-09-03 — End: ?

## 2022-09-03 MED ORDER — MELOXICAM 15 MG PO TABS: Start: 2022-09-03 — End: ?

## 2022-09-11 DIAGNOSIS — R7989 Other specified abnormal findings of blood chemistry: Principal | ICD-10-CM

## 2022-09-14 MED ORDER — BD LUER-LOK SYRINGE 23G X 1" 3 ML MISC
2 refills | Status: CP
Start: 2022-09-14 — End: ?

## 2022-11-15 ENCOUNTER — Inpatient Hospital Stay: Admit: 2022-11-15 | Discharge: 2022-11-15 | Payer: Medicare Other

## 2022-11-15 ENCOUNTER — Emergency Department: Admit: 2022-11-15 | Discharge: 2022-11-15 | Payer: Medicare Other | Primary: Family

## 2022-11-15 DIAGNOSIS — F4310 Post-traumatic stress disorder, unspecified: Secondary | ICD-10-CM

## 2022-11-15 DIAGNOSIS — J069 Acute upper respiratory infection, unspecified: Secondary | ICD-10-CM

## 2022-11-15 DIAGNOSIS — I10 Essential (primary) hypertension: Principal | ICD-10-CM

## 2022-11-15 DIAGNOSIS — R079 Chest pain, unspecified: Principal | ICD-10-CM

## 2022-11-15 DIAGNOSIS — E119 Type 2 diabetes mellitus without complications: Secondary | ICD-10-CM

## 2022-11-15 DIAGNOSIS — H6691 Otitis media, unspecified, right ear: Secondary | ICD-10-CM

## 2022-11-15 DIAGNOSIS — F32A Depression: Secondary | ICD-10-CM

## 2022-11-15 DIAGNOSIS — K219 Gastro-esophageal reflux disease without esophagitis: Secondary | ICD-10-CM

## 2022-11-15 MED ORDER — AMOXICILLIN-POT CLAVULANATE 875-125 MG PO TABS
1 | ORAL_TABLET | Freq: Two times a day (BID) | ORAL | 0 refills | Status: CP
Start: 2022-11-15 — End: ?

## 2022-11-26 ENCOUNTER — Encounter: Payer: Medicare Other | Primary: Family

## 2022-11-30 ENCOUNTER — Telehealth: Payer: Medicare Other | Attending: Family | Primary: Family

## 2022-11-30 DIAGNOSIS — Z6841 Body Mass Index (BMI) 40.0 and over, adult: Secondary | ICD-10-CM

## 2022-11-30 DIAGNOSIS — J4541 Moderate persistent asthma with (acute) exacerbation: Secondary | ICD-10-CM

## 2022-11-30 DIAGNOSIS — E782 Mixed hyperlipidemia: Secondary | ICD-10-CM

## 2022-11-30 DIAGNOSIS — F4310 Post-traumatic stress disorder, unspecified: Secondary | ICD-10-CM

## 2022-11-30 DIAGNOSIS — J029 Acute pharyngitis, unspecified: Secondary | ICD-10-CM

## 2022-11-30 DIAGNOSIS — R059 Cough in adult: Secondary | ICD-10-CM

## 2022-11-30 DIAGNOSIS — J3489 Other specified disorders of nose and nasal sinuses: Secondary | ICD-10-CM

## 2022-11-30 DIAGNOSIS — K219 Gastro-esophageal reflux disease without esophagitis: Secondary | ICD-10-CM

## 2022-11-30 DIAGNOSIS — I10 Essential (primary) hypertension: Principal | ICD-10-CM

## 2022-11-30 DIAGNOSIS — E291 Testicular hypofunction: Secondary | ICD-10-CM

## 2022-11-30 DIAGNOSIS — T383X5A Adverse effect of insulin and oral hypoglycemic [antidiabetic] drugs, initial encounter: Secondary | ICD-10-CM

## 2022-11-30 DIAGNOSIS — E119 Type 2 diabetes mellitus without complications: Secondary | ICD-10-CM

## 2022-11-30 DIAGNOSIS — H9203 Otalgia, bilateral: Secondary | ICD-10-CM

## 2022-11-30 DIAGNOSIS — F32A Depression: Secondary | ICD-10-CM

## 2022-11-30 MED ORDER — BENZONATATE 100 MG PO CAPS
100 mg | Freq: Three times a day (TID) | ORAL | 0 refills | Status: CP | PRN
Start: 2022-11-30 — End: ?

## 2022-11-30 MED ORDER — TIRZEPATIDE 2.5 MG/0.5ML SC SOPN
2.5 mg | SUBCUTANEOUS | 0 refills | Status: CP
Start: 2022-11-30 — End: ?

## 2022-11-30 MED ORDER — TIRZEPATIDE 7.5 MG/0.5ML SC SOPN
7.5 mg | SUBCUTANEOUS | 0 refills | Status: CP
Start: 2022-11-30 — End: ?

## 2022-11-30 MED ORDER — CEPHALEXIN 500 MG PO CAPS
500 mg | Freq: Two times a day (BID) | ORAL | 0 refills | Status: CP
Start: 2022-11-30 — End: ?

## 2022-11-30 MED ORDER — ALBUTEROL SULFATE (2.5 MG/3ML) 0.083% IN NEBU
2.5 mg | RESPIRATORY_TRACT | 1 refills | 25.00000 days | Status: CP | PRN
Start: 2022-11-30 — End: ?

## 2022-11-30 MED ORDER — TIRZEPATIDE 5 MG/0.5ML SC SOPN
5 mg | SUBCUTANEOUS | 0 refills | Status: CP
Start: 2022-11-30 — End: ?

## 2022-11-30 MED ORDER — PREDNISONE 10 MG (21) PO TBPK
0 refills | 5.00 days | Status: CP
Start: 2022-11-30 — End: ?

## 2022-12-28 ENCOUNTER — Ambulatory Visit: Payer: Medicare Other | Attending: Family | Primary: Family

## 2022-12-28 ENCOUNTER — Encounter: Payer: Medicare Other | Primary: Family

## 2022-12-28 DIAGNOSIS — K219 Gastro-esophageal reflux disease without esophagitis: Secondary | ICD-10-CM

## 2022-12-28 DIAGNOSIS — F4310 Post-traumatic stress disorder, unspecified: Secondary | ICD-10-CM

## 2022-12-28 DIAGNOSIS — M25562 Pain in left knee: Principal | ICD-10-CM

## 2022-12-28 DIAGNOSIS — E119 Type 2 diabetes mellitus without complications: Secondary | ICD-10-CM

## 2022-12-28 DIAGNOSIS — I10 Essential (primary) hypertension: Principal | ICD-10-CM

## 2022-12-28 DIAGNOSIS — F32A Depression: Secondary | ICD-10-CM

## 2022-12-28 DIAGNOSIS — M170 Bilateral primary osteoarthritis of knee: Secondary | ICD-10-CM

## 2022-12-28 MED ORDER — BUPIVACAINE HCL (PF) 0.25 % IJ SOLN
4 mL | Freq: Once | INTRA_ARTICULAR | Status: CP | PRN
Start: 2022-12-28 — End: ?

## 2022-12-28 MED ORDER — TRIAMCINOLONE ACETONIDE 40 MG/ML IJ SUSP
40 mg | Freq: Once | INTRA_ARTICULAR | Status: CP | PRN
Start: 2022-12-28 — End: ?

## 2023-02-19 DIAGNOSIS — E119 Type 2 diabetes mellitus without complications: Secondary | ICD-10-CM

## 2023-02-19 DIAGNOSIS — F4310 Post-traumatic stress disorder, unspecified: Secondary | ICD-10-CM

## 2023-02-19 DIAGNOSIS — F32A Depression: Secondary | ICD-10-CM

## 2023-02-19 DIAGNOSIS — I10 Essential (primary) hypertension: Principal | ICD-10-CM

## 2023-02-19 DIAGNOSIS — K219 Gastro-esophageal reflux disease without esophagitis: Secondary | ICD-10-CM

## 2023-03-09 ENCOUNTER — Ambulatory Visit: Payer: Medicare Other | Attending: Family | Primary: Family

## 2023-03-09 DIAGNOSIS — Z1331 Encounter for screening for depression: Secondary | ICD-10-CM

## 2023-03-09 DIAGNOSIS — Z0000 Encounter for general adult medical examination without abnormal findings: Secondary | ICD-10-CM

## 2023-03-09 DIAGNOSIS — Z6841 Body Mass Index (BMI) 40.0 and over, adult: Principal | ICD-10-CM

## 2023-03-09 DIAGNOSIS — E538 Deficiency of other specified B group vitamins: Secondary | ICD-10-CM

## 2023-03-09 DIAGNOSIS — E559 Vitamin D deficiency, unspecified: Secondary | ICD-10-CM

## 2023-03-09 DIAGNOSIS — K219 Gastro-esophageal reflux disease without esophagitis: Secondary | ICD-10-CM

## 2023-03-09 DIAGNOSIS — E119 Type 2 diabetes mellitus without complications: Secondary | ICD-10-CM

## 2023-03-09 DIAGNOSIS — Z1159 Encounter for screening for other viral diseases: Secondary | ICD-10-CM

## 2023-03-09 DIAGNOSIS — R5383 Other fatigue: Secondary | ICD-10-CM

## 2023-03-09 DIAGNOSIS — F32A Depression: Secondary | ICD-10-CM

## 2023-03-09 DIAGNOSIS — E291 Testicular hypofunction: Secondary | ICD-10-CM

## 2023-03-09 DIAGNOSIS — I10 Essential (primary) hypertension: Principal | ICD-10-CM

## 2023-03-09 DIAGNOSIS — F4310 Post-traumatic stress disorder, unspecified: Secondary | ICD-10-CM

## 2023-03-19 MED ORDER — MOUNJARO 2.5 MG/0.5ML SC SOAJ: Start: 2023-03-19 — End: ?

## 2023-03-19 MED ORDER — TIRZEPATIDE-WEIGHT MANAGEMENT 7.5 MG/0.5ML SC SOAJ
SUBCUTANEOUS
Start: 2023-03-19 — End: ?

## 2023-03-23 ENCOUNTER — Telehealth: Payer: Medicare Other | Attending: Family | Primary: Family

## 2023-03-23 DIAGNOSIS — K219 Gastro-esophageal reflux disease without esophagitis: Secondary | ICD-10-CM

## 2023-03-23 DIAGNOSIS — I10 Essential (primary) hypertension: Secondary | ICD-10-CM

## 2023-03-23 DIAGNOSIS — E119 Type 2 diabetes mellitus without complications: Secondary | ICD-10-CM

## 2023-03-23 DIAGNOSIS — F32A Depression: Secondary | ICD-10-CM

## 2023-03-23 DIAGNOSIS — Z6841 Body Mass Index (BMI) 40.0 and over, adult: Principal | ICD-10-CM

## 2023-03-23 DIAGNOSIS — F4310 Post-traumatic stress disorder, unspecified: Secondary | ICD-10-CM

## 2023-04-01 DIAGNOSIS — F4310 Post-traumatic stress disorder, unspecified: Secondary | ICD-10-CM

## 2023-04-01 DIAGNOSIS — I10 Essential (primary) hypertension: Principal | ICD-10-CM

## 2023-04-01 DIAGNOSIS — J45909 Unspecified asthma, uncomplicated: Secondary | ICD-10-CM

## 2023-04-01 DIAGNOSIS — E119 Type 2 diabetes mellitus without complications: Secondary | ICD-10-CM

## 2023-04-01 DIAGNOSIS — S8990XA Unspecified injury of unspecified lower leg, initial encounter: Principal | ICD-10-CM

## 2023-04-01 DIAGNOSIS — F32A Depression: Secondary | ICD-10-CM

## 2023-04-01 DIAGNOSIS — K219 Gastro-esophageal reflux disease without esophagitis: Secondary | ICD-10-CM

## 2023-04-01 MED ORDER — LIDOCAINE 5 % EX PTCH
1 | MEDICATED_PATCH | TRANSDERMAL | 0 refills | 30.00 days | Status: CP
Start: 2023-04-01 — End: ?

## 2023-04-01 MED ORDER — MELOXICAM 7.5 MG PO TABS
7.5 mg | Freq: Every day | ORAL | 0 refills | Status: CP
Start: 2023-04-01 — End: ?

## 2023-04-01 MED ORDER — OXYCODONE-ACETAMINOPHEN 5-325 MG PO TABS
1 | Freq: Once | ORAL | Status: CP
Start: 2023-04-01 — End: ?

## 2023-04-01 MED ORDER — IBUPROFEN 600 MG PO TABS
600 mg | Freq: Once | ORAL | Status: CP
Start: 2023-04-01 — End: ?

## 2023-04-01 MED ORDER — TRAMADOL HCL 50 MG PO TABS
50 mg | Freq: Four times a day (QID) | ORAL | 0 refills | 13.50000 days | Status: CP | PRN
Start: 2023-04-01 — End: ?

## 2023-04-02 ENCOUNTER — Emergency Department: Admit: 2023-04-02 | Discharge: 2023-04-02 | Payer: Medicare Other | Primary: Family

## 2023-04-02 ENCOUNTER — Inpatient Hospital Stay: Payer: Medicare Other

## 2023-04-11 DIAGNOSIS — R0609 Other forms of dyspnea: Principal | ICD-10-CM

## 2023-04-11 DIAGNOSIS — Z760 Encounter for issue of repeat prescription: Secondary | ICD-10-CM

## 2023-04-13 DIAGNOSIS — Z760 Encounter for issue of repeat prescription: Secondary | ICD-10-CM

## 2023-04-13 DIAGNOSIS — R0609 Other forms of dyspnea: Principal | ICD-10-CM

## 2023-04-13 MED ORDER — FLUTICASONE-SALMETEROL 500-50 MCG/ACT IN AEPB
1 refills | 30.00 days | Status: CP
Start: 2023-04-13 — End: ?

## 2023-04-13 MED ORDER — FLUTICASONE-SALMETEROL 500-50 MCG/ACT IN AEPB
1 refills | 30.00 days | Status: CP
Start: 2023-04-13 — End: 2023-04-13

## 2023-04-14 MED ORDER — MOMETASONE FURO-FORMOTEROL FUM 100-5 MCG/ACT IN AERO
2 | Freq: Two times a day (BID) | RESPIRATORY_TRACT | 2 refills | Status: CP
Start: 2023-04-14 — End: ?

## 2023-04-29 ENCOUNTER — Encounter: Payer: Medicare Other | Attending: Family | Primary: Family

## 2023-05-03 ENCOUNTER — Encounter: Payer: Medicare Other | Attending: Family | Primary: Family

## 2023-05-10 DIAGNOSIS — Z760 Encounter for issue of repeat prescription: Secondary | ICD-10-CM

## 2023-05-10 DIAGNOSIS — R0609 Other forms of dyspnea: Principal | ICD-10-CM

## 2023-05-13 MED ORDER — ALBUTEROL SULFATE HFA 108 (90 BASE) MCG/ACT IN AERS
RESPIRATORY_TRACT | 2 refills | 25.00000 days | Status: CP
Start: 2023-05-13 — End: ?

## 2023-06-10 ENCOUNTER — Ambulatory Visit: Payer: Medicare Other | Primary: Family

## 2023-06-10 DIAGNOSIS — E559 Vitamin D deficiency, unspecified: Secondary | ICD-10-CM

## 2023-06-10 DIAGNOSIS — E538 Deficiency of other specified B group vitamins: Secondary | ICD-10-CM

## 2023-06-10 DIAGNOSIS — R5383 Other fatigue: Principal | ICD-10-CM

## 2023-06-10 DIAGNOSIS — E291 Testicular hypofunction: Secondary | ICD-10-CM

## 2023-06-10 DIAGNOSIS — E119 Type 2 diabetes mellitus without complications: Secondary | ICD-10-CM

## 2023-06-10 DIAGNOSIS — I10 Essential (primary) hypertension: Secondary | ICD-10-CM

## 2023-06-11 ENCOUNTER — Encounter: Payer: Medicare Other | Attending: Family | Primary: Family

## 2023-06-15 ENCOUNTER — Telehealth: Payer: Medicare Other | Attending: Family | Primary: Family

## 2023-06-15 ENCOUNTER — Encounter: Payer: Medicare Other | Attending: Family | Primary: Family

## 2023-06-15 DIAGNOSIS — K219 Gastro-esophageal reflux disease without esophagitis: Secondary | ICD-10-CM

## 2023-06-15 DIAGNOSIS — J45909 Unspecified asthma, uncomplicated: Secondary | ICD-10-CM

## 2023-06-15 DIAGNOSIS — E119 Type 2 diabetes mellitus without complications: Secondary | ICD-10-CM

## 2023-06-15 DIAGNOSIS — R7989 Other specified abnormal findings of blood chemistry: Secondary | ICD-10-CM

## 2023-06-15 DIAGNOSIS — Z6841 Body Mass Index (BMI) 40.0 and over, adult: Principal | ICD-10-CM

## 2023-06-15 DIAGNOSIS — I10 Essential (primary) hypertension: Principal | ICD-10-CM

## 2023-06-15 DIAGNOSIS — Z712 Person consulting for explanation of examination or test findings: Secondary | ICD-10-CM

## 2023-06-15 DIAGNOSIS — F32A Depression: Secondary | ICD-10-CM

## 2023-06-15 DIAGNOSIS — F4310 Post-traumatic stress disorder, unspecified: Secondary | ICD-10-CM

## 2023-06-15 DIAGNOSIS — R748 Abnormal levels of other serum enzymes: Secondary | ICD-10-CM

## 2023-06-15 MED ORDER — OZEMPIC (1 MG/DOSE) 4 MG/3ML SC SOPN
1 mg | SUBCUTANEOUS | 0 refills | Status: CP
Start: 2023-06-15 — End: ?

## 2023-06-15 MED ORDER — OZEMPIC (0.25 OR 0.5 MG/DOSE) 2 MG/3ML SC SOPN
.5 mg | SUBCUTANEOUS | 0 refills | Status: CP
Start: 2023-06-15 — End: ?

## 2023-06-15 MED ORDER — OZEMPIC (0.25 OR 0.5 MG/DOSE) 2 MG/3ML SC SOPN
.25 mg | SUBCUTANEOUS | 0 refills | Status: CP
Start: 2023-06-15 — End: ?

## 2023-07-29 DIAGNOSIS — Z760 Encounter for issue of repeat prescription: Secondary | ICD-10-CM

## 2023-07-29 DIAGNOSIS — J4541 Moderate persistent asthma with (acute) exacerbation: Secondary | ICD-10-CM

## 2023-07-29 DIAGNOSIS — R7989 Other specified abnormal findings of blood chemistry: Principal | ICD-10-CM

## 2023-07-29 DIAGNOSIS — L2481 Irritant contact dermatitis due to metals: Secondary | ICD-10-CM

## 2023-07-29 DIAGNOSIS — R0609 Other forms of dyspnea: Secondary | ICD-10-CM

## 2023-08-02 MED ORDER — ALBUTEROL SULFATE (2.5 MG/3ML) 0.083% IN NEBU
2.5 mg | RESPIRATORY_TRACT | 1 refills | 25.00000 days | Status: CP | PRN
Start: 2023-08-02 — End: ?

## 2023-08-02 MED ORDER — ALBUTEROL SULFATE HFA 108 (90 BASE) MCG/ACT IN AERS
RESPIRATORY_TRACT | 2 refills | 25.00000 days | Status: CP
Start: 2023-08-02 — End: ?

## 2023-08-02 MED ORDER — TRIAMCINOLONE ACETONIDE 0.1 % EX OINT
TOPICAL | 3 refills | 14.00000 days | Status: CP
Start: 2023-08-02 — End: ?

## 2023-08-02 MED ORDER — TESTOSTERONE CYPIONATE 200 MG/ML IM SOLN
200 mg | INTRAMUSCULAR | 1 refills | Status: CP
Start: 2023-08-02 — End: ?

## 2023-09-28 ENCOUNTER — Encounter: Payer: MEDICARE | Attending: Family | Primary: Family

## 2023-09-30 ENCOUNTER — Encounter: Payer: MEDICARE | Attending: Family | Primary: Family

## 2023-10-08 MED ORDER — OXYCODONE-ACETAMINOPHEN 10-325 MG PO TABS
1 | ORAL_TABLET | ORAL
Start: 2023-10-08 — End: ?

## 2023-10-14 ENCOUNTER — Encounter: Payer: MEDICARE | Attending: Family | Primary: Family

## 2023-11-18 DIAGNOSIS — I10 Essential (primary) hypertension: Secondary | ICD-10-CM

## 2023-11-18 DIAGNOSIS — Z760 Encounter for issue of repeat prescription: Principal | ICD-10-CM

## 2023-11-18 MED ORDER — LOSARTAN POTASSIUM 50 MG PO TABS
50 mg | Freq: Two times a day (BID) | ORAL | 1 refills
Start: 2023-11-18 — End: ?

## 2023-11-28 ENCOUNTER — Emergency Department: Admit: 2023-11-28 | Discharge: 2023-11-28 | Payer: MEDICARE

## 2023-11-28 ENCOUNTER — Inpatient Hospital Stay: Admit: 2023-11-28 | Discharge: 2023-11-28 | Payer: MEDICARE

## 2023-11-28 DIAGNOSIS — R109 Unspecified abdominal pain: Principal | ICD-10-CM

## 2023-11-28 DIAGNOSIS — F32A Depression: Secondary | ICD-10-CM

## 2023-11-28 DIAGNOSIS — F4310 Post-traumatic stress disorder, unspecified: Secondary | ICD-10-CM

## 2023-11-28 DIAGNOSIS — E119 Type 2 diabetes mellitus without complications: Secondary | ICD-10-CM

## 2023-11-28 DIAGNOSIS — R197 Diarrhea, unspecified: Secondary | ICD-10-CM

## 2023-11-28 DIAGNOSIS — K219 Gastro-esophageal reflux disease without esophagitis: Secondary | ICD-10-CM

## 2023-11-28 DIAGNOSIS — I10 Essential (primary) hypertension: Principal | ICD-10-CM

## 2023-11-28 DIAGNOSIS — J45909 Unspecified asthma, uncomplicated: Secondary | ICD-10-CM

## 2023-11-28 MED ORDER — IOHEXOL 350 MG/ML IV SOLN SH
100 mL | Freq: Once | INTRAVENOUS | Status: CP
Start: 2023-11-28 — End: ?

## 2023-11-28 MED ORDER — ACETAMINOPHEN 325 MG PO TABS
975 mg | Freq: Once | ORAL | Status: CP
Start: 2023-11-28 — End: ?

## 2024-01-25 ENCOUNTER — Ambulatory Visit: Payer: MEDICARE | Attending: Family | Primary: Family

## 2024-01-25 DIAGNOSIS — Z760 Encounter for issue of repeat prescription: Principal | ICD-10-CM

## 2024-01-25 DIAGNOSIS — R7989 Other specified abnormal findings of blood chemistry: Secondary | ICD-10-CM

## 2024-01-25 DIAGNOSIS — E119 Type 2 diabetes mellitus without complications: Secondary | ICD-10-CM

## 2024-01-25 DIAGNOSIS — L2481 Irritant contact dermatitis due to metals: Secondary | ICD-10-CM

## 2024-01-25 DIAGNOSIS — Z889 Allergy status to unspecified drugs, medicaments and biological substances status: Secondary | ICD-10-CM

## 2024-01-25 DIAGNOSIS — I10 Essential (primary) hypertension: Principal | ICD-10-CM

## 2024-01-25 DIAGNOSIS — M1990 Unspecified osteoarthritis, unspecified site: Secondary | ICD-10-CM

## 2024-01-25 DIAGNOSIS — E559 Vitamin D deficiency, unspecified: Secondary | ICD-10-CM

## 2024-01-25 DIAGNOSIS — E291 Testicular hypofunction: Secondary | ICD-10-CM

## 2024-01-25 DIAGNOSIS — F4310 Post-traumatic stress disorder, unspecified: Secondary | ICD-10-CM

## 2024-01-25 DIAGNOSIS — F32A Depression: Secondary | ICD-10-CM

## 2024-01-25 DIAGNOSIS — K219 Gastro-esophageal reflux disease without esophagitis: Secondary | ICD-10-CM

## 2024-01-25 DIAGNOSIS — J45909 Unspecified asthma, uncomplicated: Secondary | ICD-10-CM

## 2024-01-25 MED ORDER — TRIAMCINOLONE ACETONIDE 0.1 % EX OINT
TOPICAL | 3 refills | 30.00000 days | Status: CP
Start: 2024-01-25 — End: ?

## 2024-01-25 MED ORDER — BD LUER-LOK SYRINGE 23G X 1" 3 ML MISC
ORAL | 2 refills | Status: CP
Start: 2024-01-25 — End: ?

## 2024-01-25 MED ORDER — METFORMIN HCL 500 MG PO TABS
500 mg | Freq: Two times a day (BID) | ORAL | 0 refills | 67.50000 days | Status: CP
Start: 2024-01-25 — End: ?

## 2024-01-25 MED ORDER — VITAMIN D3 25 MCG (1000 UT) PO CAPS
25 ug | ORAL_CAPSULE | ORAL | 1 refills | Status: CP
Start: 2024-01-25 — End: ?

## 2024-01-25 MED ORDER — LOSARTAN POTASSIUM 50 MG PO TABS
50 mg | Freq: Two times a day (BID) | ORAL | 0 refills | 90.00000 days | Status: CP
Start: 2024-01-25 — End: ?

## 2024-01-25 MED ORDER — NEEDLE (DISP) 18G X 1-1/2" MISC
0 refills | Status: CP
Start: 2024-01-25 — End: ?

## 2024-01-26 MED ORDER — TESTOSTERONE CYPIONATE 200 MG/ML IM SOLN
200 mg | INTRAMUSCULAR | 1 refills | 30.00000 days | Status: CP
Start: 2024-01-26 — End: ?

## 2024-01-28 ENCOUNTER — Ambulatory Visit: Payer: MEDICARE | Attending: Family | Primary: Family

## 2024-01-28 DIAGNOSIS — I10 Essential (primary) hypertension: Principal | ICD-10-CM

## 2024-01-28 DIAGNOSIS — R7989 Other specified abnormal findings of blood chemistry: Secondary | ICD-10-CM

## 2024-01-28 DIAGNOSIS — E291 Testicular hypofunction: Secondary | ICD-10-CM

## 2024-01-28 DIAGNOSIS — E119 Type 2 diabetes mellitus without complications: Secondary | ICD-10-CM

## 2024-01-28 DIAGNOSIS — E559 Vitamin D deficiency, unspecified: Secondary | ICD-10-CM

## 2024-01-28 DIAGNOSIS — Z0000 Encounter for general adult medical examination without abnormal findings: Secondary | ICD-10-CM

## 2024-02-09 DIAGNOSIS — F4310 Post-traumatic stress disorder, unspecified: Secondary | ICD-10-CM

## 2024-02-09 DIAGNOSIS — Z889 Allergy status to unspecified drugs, medicaments and biological substances status: Secondary | ICD-10-CM

## 2024-02-09 DIAGNOSIS — I10 Essential (primary) hypertension: Secondary | ICD-10-CM

## 2024-02-09 DIAGNOSIS — J45909 Unspecified asthma, uncomplicated: Secondary | ICD-10-CM

## 2024-02-09 DIAGNOSIS — Z7289 Other problems related to lifestyle: Secondary | ICD-10-CM

## 2024-02-09 DIAGNOSIS — M1990 Unspecified osteoarthritis, unspecified site: Secondary | ICD-10-CM

## 2024-02-09 DIAGNOSIS — E119 Type 2 diabetes mellitus without complications: Secondary | ICD-10-CM

## 2024-02-09 DIAGNOSIS — F32A Depression: Secondary | ICD-10-CM

## 2024-02-09 DIAGNOSIS — K219 Gastro-esophageal reflux disease without esophagitis: Secondary | ICD-10-CM

## 2024-02-09 DIAGNOSIS — R070 Pain in throat: Principal | ICD-10-CM

## 2024-02-09 DIAGNOSIS — R490 Dysphonia: Secondary | ICD-10-CM

## 2024-02-09 MED ORDER — DEXAMETHASONE SOD PHOSPHATE PF 10 MG/ML IJ SOLN FOR PO JX
10 mg | Freq: Once | ORAL | Status: CP
Start: 2024-02-09 — End: ?

## 2024-02-09 MED ORDER — IOHEXOL 180 MG/ML IJ SOLN
80 mL | Freq: Once | INTRAVENOUS | Status: CP
Start: 2024-02-09 — End: ?

## 2024-02-09 MED ORDER — IBUPROFEN 400 MG PO TABS
400 mg | Freq: Once | ORAL | Status: CP
Start: 2024-02-09 — End: ?

## 2024-02-09 MED ORDER — LIDOCAINE VISCOUS 2 % MT SOLN 15 ML UD
5 mL | Freq: Once | OROMUCOSAL | Status: CP
Start: 2024-02-09 — End: ?

## 2024-02-09 MED ORDER — ACETAMINOPHEN 325 MG PO TABS
650 mg | Freq: Once | ORAL | Status: CP
Start: 2024-02-09 — End: ?

## 2024-02-10 ENCOUNTER — Inpatient Hospital Stay: Admit: 2024-02-10 | Payer: MEDICARE

## 2024-02-10 ENCOUNTER — Emergency Department: Admit: 2024-02-10 | Payer: MEDICARE

## 2024-02-23 ENCOUNTER — Encounter: Payer: MEDICARE | Attending: Family | Primary: Family

## 2024-02-24 ENCOUNTER — Encounter: Payer: MEDICARE | Attending: Family | Primary: Family

## 2024-03-01 DIAGNOSIS — Z760 Encounter for issue of repeat prescription: Principal | ICD-10-CM

## 2024-03-01 DIAGNOSIS — E291 Testicular hypofunction: Secondary | ICD-10-CM

## 2024-03-01 MED ORDER — TESTOSTERONE CYPIONATE 200 MG/ML IM SOLN
200 mg | INTRAMUSCULAR | 1 refills
Start: 2024-03-01 — End: ?

## 2024-03-03 ENCOUNTER — Encounter: Payer: MEDICARE | Attending: Family | Primary: Family

## 2024-04-19 DIAGNOSIS — I10 Essential (primary) hypertension: Secondary | ICD-10-CM

## 2024-04-19 DIAGNOSIS — Z760 Encounter for issue of repeat prescription: Principal | ICD-10-CM

## 2024-04-20 ENCOUNTER — Ambulatory Visit: Payer: MEDICARE | Attending: Family | Primary: Family

## 2024-04-20 DIAGNOSIS — E66813 Class III morbid obesity with body mass index (BMI) of 40.0 to 49.9: Secondary | ICD-10-CM

## 2024-04-20 DIAGNOSIS — Z889 Allergy status to unspecified drugs, medicaments and biological substances status: Secondary | ICD-10-CM

## 2024-04-20 DIAGNOSIS — L2481 Irritant contact dermatitis due to metals: Secondary | ICD-10-CM

## 2024-04-20 DIAGNOSIS — J4541 Moderate persistent asthma with (acute) exacerbation: Secondary | ICD-10-CM

## 2024-04-20 DIAGNOSIS — F4310 Post-traumatic stress disorder, unspecified: Secondary | ICD-10-CM

## 2024-04-20 DIAGNOSIS — I10 Essential (primary) hypertension: Principal | ICD-10-CM

## 2024-04-20 DIAGNOSIS — Z760 Encounter for issue of repeat prescription: Secondary | ICD-10-CM

## 2024-04-20 DIAGNOSIS — K219 Gastro-esophageal reflux disease without esophagitis: Secondary | ICD-10-CM

## 2024-04-20 DIAGNOSIS — J45909 Unspecified asthma, uncomplicated: Secondary | ICD-10-CM

## 2024-04-20 DIAGNOSIS — F32A Depression: Secondary | ICD-10-CM

## 2024-04-20 DIAGNOSIS — E119 Type 2 diabetes mellitus without complications: Secondary | ICD-10-CM

## 2024-04-20 DIAGNOSIS — M1990 Unspecified osteoarthritis, unspecified site: Secondary | ICD-10-CM

## 2024-04-20 MED ORDER — TRIAMCINOLONE ACETONIDE 0.1 % EX OINT
TOPICAL | 3 refills | 14.00000 days | Status: CP
Start: 2024-04-20 — End: ?

## 2024-04-20 MED ORDER — PROAIR HFA 108 (90 BASE) MCG/ACT IN AERS
2 | Freq: Four times a day (QID) | RESPIRATORY_TRACT | 2 refills | 25.00000 days | Status: CP | PRN
Start: 2024-04-20 — End: ?

## 2024-04-20 MED ORDER — LOSARTAN POTASSIUM 50 MG PO TABS
50 mg | Freq: Two times a day (BID) | ORAL | 0 refills | Status: CN
Start: 2024-04-20 — End: ?

## 2024-04-20 MED ORDER — LOSARTAN POTASSIUM 50 MG PO TABS
50 mg | Freq: Two times a day (BID) | ORAL | 0 refills
Start: 2024-04-20 — End: ?

## 2024-04-20 MED ORDER — LOSARTAN POTASSIUM 50 MG PO TABS
50 mg | Freq: Two times a day (BID) | ORAL | 0 refills | 30.00000 days | Status: CP
Start: 2024-04-20 — End: ?

## 2024-04-21 DIAGNOSIS — Z760 Encounter for issue of repeat prescription: Principal | ICD-10-CM

## 2024-04-21 DIAGNOSIS — E119 Type 2 diabetes mellitus without complications: Secondary | ICD-10-CM

## 2024-04-21 MED ORDER — METFORMIN HCL 500 MG PO TABS
500 mg | Freq: Two times a day (BID) | ORAL | 0 refills | 90.00000 days | Status: CP
Start: 2024-04-21 — End: ?

## 2024-05-07 ENCOUNTER — Inpatient Hospital Stay: Payer: MEDICARE

## 2024-05-07 DIAGNOSIS — J069 Acute upper respiratory infection, unspecified: Principal | ICD-10-CM

## 2024-05-07 DIAGNOSIS — J329 Chronic sinusitis, unspecified: Secondary | ICD-10-CM

## 2024-05-07 MED ORDER — AMOXICILLIN-POT CLAVULANATE 875-125 MG PO TABS
1 | ORAL_TABLET | Freq: Two times a day (BID) | ORAL | 0 refills | 10.00000 days | Status: CP
Start: 2024-05-07 — End: ?

## 2024-05-07 MED ORDER — PSEUDOEPH-BROMPHEN-DM 30-2-10 MG/5ML PO SYRP
10 mL | Freq: Four times a day (QID) | ORAL | 0 refills | Status: CP | PRN
Start: 2024-05-07 — End: ?

## 2024-05-07 MED ORDER — ACETAMINOPHEN 325 MG PO TABS
975 mg | Freq: Once | ORAL | Status: CP
Start: 2024-05-07 — End: ?

## 2024-05-07 MED ORDER — DEXAMETHASONE SOD PHOSPHATE PF 10 MG/ML IJ SOLN FOR PO JX
10 mg | Freq: Once | ORAL | Status: CP
Start: 2024-05-07 — End: ?

## 2024-05-25 ENCOUNTER — Ambulatory Visit: Payer: MEDICARE | Attending: Family | Primary: Family

## 2024-05-25 DIAGNOSIS — E119 Type 2 diabetes mellitus without complications: Secondary | ICD-10-CM

## 2024-05-25 DIAGNOSIS — Z889 Allergy status to unspecified drugs, medicaments and biological substances status: Secondary | ICD-10-CM

## 2024-05-25 DIAGNOSIS — F4310 Post-traumatic stress disorder, unspecified: Secondary | ICD-10-CM

## 2024-05-25 DIAGNOSIS — M1990 Unspecified osteoarthritis, unspecified site: Secondary | ICD-10-CM

## 2024-05-25 DIAGNOSIS — F32A Depression: Secondary | ICD-10-CM

## 2024-05-25 DIAGNOSIS — B372 Candidiasis of skin and nail: Secondary | ICD-10-CM

## 2024-05-25 DIAGNOSIS — L239 Allergic contact dermatitis, unspecified cause: Secondary | ICD-10-CM

## 2024-05-25 DIAGNOSIS — I10 Essential (primary) hypertension: Principal | ICD-10-CM

## 2024-05-25 DIAGNOSIS — Z760 Encounter for issue of repeat prescription: Secondary | ICD-10-CM

## 2024-05-25 DIAGNOSIS — K219 Gastro-esophageal reflux disease without esophagitis: Secondary | ICD-10-CM

## 2024-05-25 DIAGNOSIS — E291 Testicular hypofunction: Secondary | ICD-10-CM

## 2024-05-25 DIAGNOSIS — E7841 Elevated Lipoprotein(a): Secondary | ICD-10-CM

## 2024-05-25 DIAGNOSIS — J45909 Unspecified asthma, uncomplicated: Secondary | ICD-10-CM

## 2024-05-25 MED ORDER — HYDROCORTISONE 2.5 % EX CREA
0 refills | Status: CP
Start: 2024-05-25 — End: ?

## 2024-05-25 MED ORDER — TESTOSTERONE CYPIONATE 200 MG/ML IM SOLN
200 mg | INTRAMUSCULAR | 1 refills | Status: CP
Start: 2024-05-25 — End: ?

## 2024-05-25 MED ORDER — KETOCONAZOLE 2 % EX CREA
2 refills | Status: CP
Start: 2024-05-25 — End: ?

## 2024-05-25 MED ORDER — ROSUVASTATIN CALCIUM 5 MG PO TABS
5 mg | Freq: Every evening | ORAL | 1 refills | Status: CP
Start: 2024-05-25 — End: ?

## 2024-06-05 DIAGNOSIS — L239 Allergic contact dermatitis, unspecified cause: Principal | ICD-10-CM

## 2024-06-06 MED ORDER — HYDROCORTISONE 2.5 % EX CREA
Freq: Two times a day (BID) | TOPICAL | 0 refills | Status: CP
Start: 2024-06-06 — End: ?
